# Patient Record
Sex: Female | Born: 1980 | Race: Black or African American | Hispanic: No | Marital: Single | Smoking: Never smoker
Health system: Southern US, Community
[De-identification: ages and names within clinical notes are randomized; demographics above are authoritative.]

## PROBLEM LIST (undated history)

## (undated) ENCOUNTER — Inpatient Hospital Stay (HOSPITAL_COMMUNITY): Payer: Self-pay

## (undated) DIAGNOSIS — O24419 Gestational diabetes mellitus in pregnancy, unspecified control: Secondary | ICD-10-CM

## (undated) DIAGNOSIS — D649 Anemia, unspecified: Secondary | ICD-10-CM

## (undated) HISTORY — DX: Gestational diabetes mellitus in pregnancy, unspecified control: O24.419

---
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
## 2001-04-16 ENCOUNTER — Other Ambulatory Visit: Admission: RE | Admit: 2001-04-16 | Discharge: 2001-04-16 | Payer: Self-pay | Admitting: Obstetrics and Gynecology

## 2001-04-28 ENCOUNTER — Encounter: Payer: Self-pay | Admitting: Family Medicine

## 2001-04-28 ENCOUNTER — Encounter: Admission: RE | Admit: 2001-04-28 | Discharge: 2001-04-28 | Payer: Self-pay | Admitting: Family Medicine

## 2001-04-29 ENCOUNTER — Encounter: Payer: Self-pay | Admitting: Emergency Medicine

## 2001-04-29 ENCOUNTER — Emergency Department (HOSPITAL_COMMUNITY): Admission: EM | Admit: 2001-04-29 | Discharge: 2001-04-29 | Payer: Self-pay | Admitting: Emergency Medicine

## 2001-08-19 ENCOUNTER — Emergency Department (HOSPITAL_COMMUNITY): Admission: EM | Admit: 2001-08-19 | Discharge: 2001-08-19 | Payer: Self-pay | Admitting: Emergency Medicine

## 2001-11-14 ENCOUNTER — Emergency Department (HOSPITAL_COMMUNITY): Admission: EM | Admit: 2001-11-14 | Discharge: 2001-11-14 | Payer: Self-pay | Admitting: *Deleted

## 2008-04-21 DIAGNOSIS — O24419 Gestational diabetes mellitus in pregnancy, unspecified control: Secondary | ICD-10-CM

## 2008-04-21 HISTORY — DX: Gestational diabetes mellitus in pregnancy, unspecified control: O24.419

## 2008-09-08 ENCOUNTER — Inpatient Hospital Stay (HOSPITAL_COMMUNITY): Admission: AD | Admit: 2008-09-08 | Discharge: 2008-09-08 | Payer: Self-pay | Admitting: Obstetrics & Gynecology

## 2008-09-08 ENCOUNTER — Ambulatory Visit: Payer: Self-pay | Admitting: Family Medicine

## 2009-01-13 DIAGNOSIS — O24410 Gestational diabetes mellitus in pregnancy, diet controlled: Secondary | ICD-10-CM

## 2010-07-30 LAB — URINALYSIS, ROUTINE W REFLEX MICROSCOPIC
Bilirubin Urine: NEGATIVE
Glucose, UA: NEGATIVE mg/dL
Hgb urine dipstick: NEGATIVE
Ketones, ur: NEGATIVE mg/dL
Nitrite: NEGATIVE
Protein, ur: NEGATIVE mg/dL
Specific Gravity, Urine: 1.02 (ref 1.005–1.030)
Urobilinogen, UA: 0.2 mg/dL (ref 0.0–1.0)
pH: 6 (ref 5.0–8.0)

## 2011-02-16 ENCOUNTER — Emergency Department (HOSPITAL_COMMUNITY)
Admission: EM | Admit: 2011-02-16 | Discharge: 2011-02-16 | Disposition: A | Discharge status: Home / Self Care | Payer: No Typology Code available for payment source | Attending: Emergency Medicine | Admitting: Emergency Medicine

## 2011-02-16 ENCOUNTER — Emergency Department (HOSPITAL_COMMUNITY): Payer: No Typology Code available for payment source

## 2011-02-16 DIAGNOSIS — S0180XA Unspecified open wound of other part of head, initial encounter: Secondary | ICD-10-CM | POA: Insufficient documentation

## 2011-02-16 DIAGNOSIS — Y92410 Unspecified street and highway as the place of occurrence of the external cause: Secondary | ICD-10-CM | POA: Insufficient documentation

## 2011-02-16 DIAGNOSIS — IMO0002 Reserved for concepts with insufficient information to code with codable children: Secondary | ICD-10-CM | POA: Insufficient documentation

## 2011-02-16 DIAGNOSIS — R109 Unspecified abdominal pain: Secondary | ICD-10-CM | POA: Insufficient documentation

## 2011-02-16 DIAGNOSIS — Y998 Other external cause status: Secondary | ICD-10-CM | POA: Insufficient documentation

## 2011-02-16 DIAGNOSIS — S060X9A Concussion with loss of consciousness of unspecified duration, initial encounter: Secondary | ICD-10-CM | POA: Insufficient documentation

## 2011-02-18 ENCOUNTER — Inpatient Hospital Stay (INDEPENDENT_AMBULATORY_CARE_PROVIDER_SITE_OTHER)
Admission: RE | Admit: 2011-02-18 | Discharge: 2011-02-18 | Disposition: A | Discharge status: Home / Self Care | Payer: No Typology Code available for payment source | Source: Ambulatory Visit | Attending: Family Medicine | Admitting: Family Medicine

## 2011-02-18 DIAGNOSIS — S139XXA Sprain of joints and ligaments of unspecified parts of neck, initial encounter: Secondary | ICD-10-CM

## 2012-10-05 ENCOUNTER — Emergency Department (HOSPITAL_COMMUNITY)
Admission: EM | Admit: 2012-10-05 | Discharge: 2012-10-05 | Disposition: A | Discharge status: Home / Self Care | Payer: Self-pay | Source: Home / Self Care | Attending: Family Medicine | Admitting: Family Medicine

## 2012-10-05 ENCOUNTER — Encounter (HOSPITAL_COMMUNITY): Payer: Self-pay | Admitting: Emergency Medicine

## 2012-10-05 ENCOUNTER — Other Ambulatory Visit (HOSPITAL_COMMUNITY)
Admission: RE | Admit: 2012-10-05 | Discharge: 2012-10-05 | Disposition: A | Discharge status: Home / Self Care | Payer: No Typology Code available for payment source | Source: Ambulatory Visit | Attending: Family Medicine | Admitting: Family Medicine

## 2012-10-05 DIAGNOSIS — N760 Acute vaginitis: Secondary | ICD-10-CM

## 2012-10-05 DIAGNOSIS — Z113 Encounter for screening for infections with a predominantly sexual mode of transmission: Secondary | ICD-10-CM | POA: Insufficient documentation

## 2012-10-05 LAB — POCT PREGNANCY, URINE: Preg Test, Ur: NEGATIVE

## 2012-10-05 LAB — POCT URINALYSIS DIP (DEVICE)
Glucose, UA: NEGATIVE mg/dL
Nitrite: NEGATIVE
Protein, ur: NEGATIVE mg/dL
Specific Gravity, Urine: >=1.03 (ref 1.005–1.030)
Urobilinogen, UA: 0.2 mg/dL (ref 0.0–1.0)

## 2012-10-05 MED ORDER — METRONIDAZOLE 500 MG PO TABS: 500.0000 mg | ORAL_TABLET | Freq: Two times a day (BID) | ORAL | Status: DC

## 2012-10-05 NOTE — ED Provider Notes (Signed)
History     CSN: [id number]  Arrival date & time 10/05/12  1327   First MD Initiated Contact with Patient 10/05/12 1336      Chief Complaint  Patient presents with  . Urinary Tract Infection  . Vaginitis    (Consider location/radiation/quality/duration/timing/severity/associated sxs/prior treatment) Patient is a 31 y.o. female presenting with urinary tract infection. The history is provided by the patient.  Urinary Tract Infection This is a new problem. The current episode started more than 1 week ago (just started menses after depo use, having odor and itching andurinary pressure). The problem has not changed since onset.Associated symptoms include abdominal pain.    History reviewed. No pertinent past medical history.  History reviewed. No pertinent past surgical history.  No family history on file.  History  Substance Use Topics  . Smoking status: Never Smoker   . Smokeless tobacco: Not on file  . Alcohol Use: Yes     Comment: occasionally    OB History   Grav Para Term Preterm Abortions TAB SAB Ect Mult Living                  Review of Systems  Constitutional: Negative.   Gastrointestinal: Positive for abdominal pain. Negative for nausea, vomiting, diarrhea and constipation.  Genitourinary: Positive for dysuria, vaginal bleeding and menstrual problem. Negative for vaginal discharge.  Musculoskeletal: Negative.   Skin: Negative.     Allergies  Review of patient's allergies indicates no known allergies.  Home Medications   Current Outpatient Rx  Name  Route  Sig  Dispense  Refill  . metroNIDAZOLE (FLAGYL) 500 MG tablet   Oral   Take 1 tablet (500 mg total) by mouth 2 (two) times daily.   14 tablet   0     BP 111/59  Pulse 83  Temp(Src) 98.6 F (37 C) (Oral)  Resp 16  SpO2 100%  LMP 10/05/2012  Physical Exam  Nursing note and vitals reviewed. Constitutional: She is oriented to person, place, and time. She appears well-developed and  well-nourished.  Abdominal: Soft. Bowel sounds are normal. She exhibits no distension and no mass. There is no tenderness. There is no rebound and no guarding.  Genitourinary: Uterus is tender. Uterus is not deviated and not enlarged. Cervix exhibits no motion tenderness, no discharge and no friability. Right adnexum displays no mass and no tenderness. Left adnexum displays no mass and no tenderness. There is bleeding around the vagina. No erythema or tenderness around the vagina. No foreign body around the vagina. No vaginal discharge found.    Neurological: She is alert and oriented to person, place, and time.  Skin: Skin is warm and dry.    ED Course  Procedures (including critical care time)  Labs Reviewed  POCT URINALYSIS DIP (DEVICE) - Abnormal; Notable for the following:    Hgb urine dipstick MODERATE (*)    Leukocytes, UA TRACE (*)    All other components within normal limits  POCT PREGNANCY, URINE  CERVICOVAGINAL ANCILLARY ONLY   No results found.   1. Vaginitis       MDM  U/a pt on menses        Linna Hoff, MD 10/05/12 1426

## 2012-10-05 NOTE — ED Notes (Signed)
Pt c/o sx including foul smelling urine and dysuria x 1 month. Patient stopped depo shot last month and began bleeding again. Took Metro pills and began having vaginal itching. Patient is alert and oriented.

## 2012-10-06 NOTE — ED Notes (Signed)
GC/Chlmaydia neg., Affirm: Candida pos., Gardnerella and Trich neg.  Message sent to Dr. Lucilla Edin order. Vassie Moselle 10/06/2012

## 2012-10-10 ENCOUNTER — Telehealth (HOSPITAL_COMMUNITY): Payer: Self-pay | Admitting: *Deleted

## 2012-10-10 MED ORDER — TERCONAZOLE 80 MG VA SUPP: 80.0000 mg | Freq: Every day | VAGINAL | Status: DC

## 2012-10-10 MED ORDER — FLUCONAZOLE 150 MG PO TABS: 150.0000 mg | ORAL_TABLET | Freq: Once | ORAL | Status: DC

## 2012-10-10 NOTE — ED Notes (Signed)
Order obtained for Diflucan and Terazol supp. from Dr. Artis Flock. I called pt. But VM is not set up.  Unable to leave a message. Call 1. Kimberly Macias 10/10/2012

## 2012-10-10 NOTE — ED Notes (Signed)
Pt. called back.  Pt. verified x 2 and given results.  Pt. told she needs Diflucan and Terazol supp. for Candida. Pt. wants Rx. called to the [facility] on [address].  Rx. called to VM @ [phone]. Pt. Called back a few minutes later and asked what the urine culture showed. I told her that one was not ordered. I gave her the result of the U-dip.  She said that is the main reason she came was for urinary symptoms.  She does not think the doctor was listening to what she said.  I told her to take this medicine and if still having symptoms to call back. Vassie Moselle 10/10/2012

## 2012-12-24 ENCOUNTER — Encounter (HOSPITAL_COMMUNITY): Payer: Self-pay | Admitting: *Deleted

## 2012-12-24 ENCOUNTER — Inpatient Hospital Stay (HOSPITAL_COMMUNITY)
Admission: AD | Admit: 2012-12-24 | Discharge: 2012-12-24 | Disposition: A | Discharge status: Home / Self Care | Payer: Self-pay | Source: Ambulatory Visit | Attending: Obstetrics & Gynecology | Admitting: Obstetrics & Gynecology

## 2012-12-24 DIAGNOSIS — R300 Dysuria: Secondary | ICD-10-CM | POA: Insufficient documentation

## 2012-12-24 DIAGNOSIS — A6000 Herpesviral infection of urogenital system, unspecified: Secondary | ICD-10-CM | POA: Insufficient documentation

## 2012-12-24 DIAGNOSIS — N949 Unspecified condition associated with female genital organs and menstrual cycle: Secondary | ICD-10-CM | POA: Insufficient documentation

## 2012-12-24 DIAGNOSIS — N390 Urinary tract infection, site not specified: Secondary | ICD-10-CM | POA: Insufficient documentation

## 2012-12-24 LAB — WET PREP, GENITAL
Trich, Wet Prep: NONE SEEN
Yeast Wet Prep HPF POC: NONE SEEN

## 2012-12-24 LAB — URINALYSIS, ROUTINE W REFLEX MICROSCOPIC
Bilirubin Urine: NEGATIVE
Glucose, UA: NEGATIVE mg/dL
Ketones, ur: NEGATIVE mg/dL
Nitrite: POSITIVE — AB
Protein, ur: 30 mg/dL — AB
pH: 5.5 (ref 5.0–8.0)

## 2012-12-24 LAB — URINE MICROSCOPIC-ADD ON

## 2012-12-24 MED ORDER — ONDANSETRON 8 MG PO TBDP
8.0000 mg | ORAL_TABLET | Freq: Once | ORAL | Status: AC
  Administered 2012-12-24: 8 mg via ORAL
  Filled 2012-12-24: qty 1

## 2012-12-24 MED ORDER — FLUCONAZOLE 150 MG PO TABS: 150.0000 mg | ORAL_TABLET | Freq: Once | ORAL | Status: DC

## 2012-12-24 MED ORDER — CIPROFLOXACIN HCL 500 MG PO TABS: 500.0000 mg | ORAL_TABLET | Freq: Two times a day (BID) | ORAL | Status: AC
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
## 2012-12-24 NOTE — MAU Note (Signed)
Patient states she has had a lump in the left groin area for 2 days that is painful and makes it difficult to walk, Has had pain with urination for about 4 days. Has an increase in her normal vaginal discharge. Nausea, no vomiting.
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
## 2012-12-25 LAB — GC/CHLAMYDIA PROBE AMP
CT Probe RNA: NEGATIVE
GC Probe RNA: NEGATIVE

## 2012-12-27 LAB — URINE CULTURE: Colony Count: >=100000

## 2013-11-12 ENCOUNTER — Emergency Department (HOSPITAL_COMMUNITY)
Admission: EM | Admit: 2013-11-12 | Discharge: 2013-11-12 | Disposition: A | Discharge status: Home / Self Care | Payer: Medicaid Other | Attending: Emergency Medicine | Admitting: Emergency Medicine

## 2013-11-12 ENCOUNTER — Encounter (HOSPITAL_COMMUNITY): Payer: Self-pay | Admitting: Emergency Medicine

## 2013-11-12 ENCOUNTER — Emergency Department (HOSPITAL_COMMUNITY): Payer: Medicaid Other

## 2013-11-12 DIAGNOSIS — Z79899 Other long term (current) drug therapy: Secondary | ICD-10-CM | POA: Insufficient documentation

## 2013-11-12 DIAGNOSIS — J020 Streptococcal pharyngitis: Secondary | ICD-10-CM | POA: Diagnosis not present

## 2013-11-12 DIAGNOSIS — R0789 Other chest pain: Secondary | ICD-10-CM | POA: Diagnosis not present

## 2013-11-12 DIAGNOSIS — R079 Chest pain, unspecified: Secondary | ICD-10-CM

## 2013-11-12 DIAGNOSIS — J029 Acute pharyngitis, unspecified: Secondary | ICD-10-CM | POA: Diagnosis present

## 2013-11-12 LAB — I-STAT TROPONIN, ED: Troponin i, poc: 0 ng/mL (ref 0.00–0.08)

## 2013-11-12 LAB — CBC
HEMATOCRIT: 40.6 % (ref 36.0–46.0)
Hemoglobin: 13.9 g/dL (ref 12.0–15.0)
MCH: 32.9 pg (ref 26.0–34.0)
MCHC: 34.2 g/dL (ref 30.0–36.0)
MCV: 96.2 fL (ref 78.0–100.0)
Platelets: 259 10*3/uL (ref 150–400)
RBC: 4.22 MIL/uL (ref 3.87–5.11)
RDW: 13.1 % (ref 11.5–15.5)
WBC: 7.7 10*3/uL (ref 4.0–10.5)

## 2013-11-12 LAB — BASIC METABOLIC PANEL
Anion gap: 19 — ABNORMAL HIGH (ref 5–15)
BUN: 10 mg/dL (ref 6–23)
CO2: 23 mEq/L (ref 19–32)
Calcium: 9.3 mg/dL (ref 8.4–10.5)
Chloride: 94 mEq/L — ABNORMAL LOW (ref 96–112)
Creatinine, Ser: 0.63 mg/dL (ref 0.50–1.10)
Glucose, Bld: 152 mg/dL — ABNORMAL HIGH (ref 70–99)
POTASSIUM: 3.1 meq/L — AB (ref 3.7–5.3)
Sodium: 136 mEq/L — ABNORMAL LOW (ref 137–147)

## 2013-11-12 MED ORDER — HYDROCODONE-ACETAMINOPHEN 7.5-325 MG/15ML PO SOLN
10.0000 mL | Freq: Once | ORAL | Status: AC
  Administered 2013-11-12: 10 mL via ORAL
  Filled 2013-11-12: qty 15

## 2013-11-12 MED ORDER — HYDROCODONE-ACETAMINOPHEN 7.5-325 MG/15ML PO SOLN: 10.0000 mL | ORAL | Status: DC | PRN

## 2013-11-12 MED ORDER — ACETAMINOPHEN 325 MG PO TABS
650.0000 mg | ORAL_TABLET | Freq: Once | ORAL | Status: AC
  Administered 2013-11-12: 650 mg via ORAL
  Filled 2013-11-12: qty 2

## 2013-11-12 MED ORDER — PENICILLIN G BENZATHINE 1200000 UNIT/2ML IM SUSP
1.2000 10*6.[IU] | Freq: Once | INTRAMUSCULAR | Status: AC
  Administered 2013-11-12: 1.2 10*6.[IU] via INTRAMUSCULAR
  Filled 2013-11-12: qty 2

## 2013-11-12 NOTE — ED Notes (Signed)
Pt reports having a sore throat for several days, thinks she has strep throat but now also having chest pains x 2 days. Denies cough. Reports fever. Airway intact.

## 2013-11-12 NOTE — Discharge Instructions (Signed)
You have been treated for strep pharyngitis. Take the prescribed medication as directed. Follow-up with Dr. Terrence Dupont if chest pain continues. Return to the ED for new or worsening symptoms.

## 2013-11-12 NOTE — ED Provider Notes (Signed)
CSN: [id number]     Arrival date & time 11/12/13  1118 History   First MD Initiated Contact with Patient 11/12/13 1127     Chief Complaint  Patient presents with  . Chest Pain  . Sore Throat     (Consider location/radiation/quality/duration/timing/severity/associated sxs/prior Treatment) The history is provided by the patient and medical records.   This is a 33 y.o. F presenting to the ED for sore throat and chest pain.  Patient sx states initially started as a sore throat approx 5 days ago.  She states she saw "white spots" on both her tonsils.  She states swallowing is very painful but she has no difficulty doing so.  Endorses subjective fever.  States 2 days ago she developed left sided chest pain, initially was a 9/10 but has started to resolve.  She denies associated SOB, diaphoresis, radiation into neck or extremities, palpitations, dizziness, weakness.  She has no prior hx of DVT or PE.  No recent travel, surgeries, exogenous estrogens, calf swelling, or calf pain.  She is currently following with Dr. Terrence Dupont for evaluation of possible arrythmia but has not been wearing her heart monitor as instructed.  History reviewed. No pertinent past medical history. History reviewed. No pertinent past surgical history. History reviewed. No pertinent family history. History  Substance Use Topics  . Smoking status: Never Smoker   . Smokeless tobacco: Not on file  . Alcohol Use: Yes     Comment: occasionally   OB History   Grav Para Term Preterm Abortions TAB SAB Ect Mult Living   3 1 1  0 2 2 0 0 0 1     Review of Systems  HENT: Positive for sore throat.   Cardiovascular: Positive for chest pain.  All other systems reviewed and are negative.     Allergies  Review of patient's allergies indicates no known allergies.  Home Medications   Prior to Admission medications   Medication Sig Start Date End Date Taking? Authorizing Provider  fluconazole (DIFLUCAN) 150 MG tablet Take 1  tablet (150 mg total) by mouth once. 12/24/12   Olegario Messier, NP  HYDROcodone-acetaminophen (NORCO/VICODIN) 5-325 MG per tablet Take 1 tablet by mouth every 6 (six) hours as needed for pain.    Historical Provider, MD  Phenazopyridine HCl (AZO STANDARD MAXIMUM STRENGTH) 97.5 MG TABS Take by mouth.    Historical Provider, MD   BP 119/72  Pulse 100  Temp(Src) 100.4 F (38 C) (Oral)  Resp 16  SpO2 99%  LMP 11/02/2013  Physical Exam  Nursing note and vitals reviewed. Constitutional: She is oriented to person, place, and time. She appears well-developed and well-nourished.  HENT:  Head: Normocephalic and atraumatic.  Mouth/Throat: Uvula is midline and mucous membranes are normal. Posterior oropharyngeal erythema present. No tonsillar abscesses.  Tonsils 1+ bilaterally with exudates present; uvula midline without peritonsillar abscess; handling secretions appropriately; no difficulty swallowing or speaking  Eyes: Conjunctivae and EOM are normal. Pupils are equal, round, and reactive to light.  Neck: Normal range of motion.  Cardiovascular: Normal rate, regular rhythm and normal heart sounds.   Pulmonary/Chest: Effort normal and breath sounds normal. No respiratory distress. She has no wheezes.  Abdominal: Soft. Bowel sounds are normal.  Musculoskeletal: Normal range of motion.  Neurological: She is alert and oriented to person, place, and time.  Skin: Skin is warm and dry.  Psychiatric: She has a normal mood and affect.    ED Course  Procedures (including critical care time) Labs Review  Labs Reviewed  BASIC METABOLIC PANEL - Abnormal; Notable for the following:    Sodium 136 (*)    Potassium 3.1 (*)    Chloride 94 (*)    Glucose, Bld 152 (*)    Anion gap 19 (*)    All other components within normal limits  CBC  I-STAT TROPOININ, ED    Imaging Review Dg Chest 2 View  11/12/2013   CLINICAL DATA:  Strep throat, fever and chest pain.  EXAM: CHEST  2 VIEW  COMPARISON:   02/16/2011.  FINDINGS: The heart size and mediastinal contours are within normal limits. Both lungs are clear. The visualized skeletal structures are unremarkable.  IMPRESSION: Normal chest x-ray.   Electronically Signed   By: Kalman Jewels M.D.   On: 11/12/2013 13:05     EKG Interpretation   Date/Time:  Saturday November 12 2013 11:46:07 EDT Ventricular Rate:  94 PR Interval:  134 QRS Duration: 76 QT Interval:  345 QTC Calculation: 431 R Axis:   73 Text Interpretation:  Sinus rhythm Normal ECG Confirmed by BEATON  MD,  ROBERT ([id number]) on 11/12/2013 12:03:54 PM      MDM   Final diagnoses:  Strep pharyngitis  Chest pain, unspecified chest pain type   33 y.o. F with sore throat and CP.  On exam she is afebrile and non-toxic appearing, airway is patent, tonsils enlarged with exudates present.  Clinical concern for streph pharyngitis.  CP sounds atypical but will obtain basic labs and CXR.  Pt treated with bicillin for strep.  hycet and tylenol given.  EKG sinus rhythm without ischemic changes.  Trop negative.  Lab work with subtle electrolyte abnormalities, anion gap elevated at 19 but non-specific, glucose 152.  Clinically no signs/sx concerning for DKA.  Patient remains afebrile and overall well appearing. At this time I have low suspicion for a ACS, PE, dissection, or acute cardiac event and so the patient may be safely discharged home. Rx hycet, recommended supportive care including fluids and rest.  She is instructed to follow up with Dr. Terrence Dupont.  Discussed plan with patient, he/she acknowledged understanding and agreed with plan of care.  Return precautions given for new or worsening symptoms.  Larene Pickett, PA-C 11/12/13 Vernelle Emerald

## 2013-11-13 NOTE — ED Provider Notes (Signed)
Medical screening examination/treatment/procedure(s) were performed by non-physician practitioner and as supervising physician I was immediately available for consultation/collaboration.    Dot Lanes, MD 11/13/13 [phone]

## 2013-12-07 ENCOUNTER — Emergency Department (INDEPENDENT_AMBULATORY_CARE_PROVIDER_SITE_OTHER)
Admission: EM | Admit: 2013-12-07 | Discharge: 2013-12-07 | Disposition: A | Discharge status: Home / Self Care | Payer: Self-pay | Source: Home / Self Care | Attending: Emergency Medicine | Admitting: Emergency Medicine

## 2013-12-07 ENCOUNTER — Encounter (HOSPITAL_COMMUNITY): Payer: Self-pay | Admitting: Emergency Medicine

## 2013-12-07 ENCOUNTER — Other Ambulatory Visit (HOSPITAL_COMMUNITY)
Admission: RE | Admit: 2013-12-07 | Discharge: 2013-12-07 | Disposition: A | Discharge status: Home / Self Care | Payer: Medicaid Other | Source: Ambulatory Visit | Attending: Emergency Medicine | Admitting: Emergency Medicine

## 2013-12-07 DIAGNOSIS — Z113 Encounter for screening for infections with a predominantly sexual mode of transmission: Secondary | ICD-10-CM | POA: Insufficient documentation

## 2013-12-07 DIAGNOSIS — B379 Candidiasis, unspecified: Secondary | ICD-10-CM

## 2013-12-07 DIAGNOSIS — A499 Bacterial infection, unspecified: Secondary | ICD-10-CM

## 2013-12-07 DIAGNOSIS — N760 Acute vaginitis: Secondary | ICD-10-CM | POA: Insufficient documentation

## 2013-12-07 DIAGNOSIS — B9689 Other specified bacterial agents as the cause of diseases classified elsewhere: Secondary | ICD-10-CM

## 2013-12-07 MED ORDER — METRONIDAZOLE 500 MG PO TABS: 2000.0000 mg | ORAL_TABLET | Freq: Once | ORAL | Status: DC

## 2013-12-07 MED ORDER — FLUCONAZOLE 150 MG PO TABS: 150.0000 mg | ORAL_TABLET | Freq: Once | ORAL | Status: DC

## 2013-12-07 NOTE — ED Notes (Signed)
Patient complains of having greenish/yellow vaginal discharge That started about four days ago Denies any odor

## 2013-12-07 NOTE — Discharge Instructions (Signed)
It looks like you have BV and a yeast infection. Take the flagyl first - 4 pills all at once.  This will likely upset your stomach. The next day take diflucan 1 pill.   You can repeat this dose in 2 days if needed.  We will call you if anything else comes back positive. Followup as needed.

## 2013-12-07 NOTE — ED Provider Notes (Signed)
CSN: [id number]     Arrival date & time 12/07/13  1807 History   First MD Initiated Contact with Patient 12/07/13 1854     Chief Complaint  Patient presents with  . Vaginal Discharge   (Consider location/radiation/quality/duration/timing/severity/associated sxs/prior Treatment) HPI  is a 33 year old female here for evaluation of vaginal discharge. She states this started about 4 days ago with a thick white discharge. Was associated with itching. She douched and took several metronidazole tablets. The discharge changed to a more yellow-green color. It remains itchy. No vaginal pain. No foul odor. No new sexual contacts, but has had unprotected sex with her partner. No abdominal pain. No fevers or chills. No nausea or vomiting. No urinary symptoms.  History reviewed. No pertinent past medical history. History reviewed. No pertinent past surgical history. No family history on file. History  Substance Use Topics  . Smoking status: Never Smoker   . Smokeless tobacco: Not on file  . Alcohol Use: Yes     Comment: occasionally   OB History   Grav Para Term Preterm Abortions TAB SAB Ect Mult Living   3 1 1  0 2 2 0 0 0 1     Review of Systems  Constitutional: Negative.   Gastrointestinal: Negative.   Genitourinary: Positive for vaginal discharge. Negative for dysuria and vaginal pain.  Musculoskeletal: Negative.     Allergies  Review of patient's allergies indicates no known allergies.  Home Medications   Prior to Admission medications   Medication Sig Start Date End Date Taking? Authorizing Provider  fluconazole (DIFLUCAN) 150 MG tablet Take 1 tablet (150 mg total) by mouth once. Repeat dose in 3 days if needed 12/07/13   Melony Overly, MD  HYDROcodone-acetaminophen (HYCET) 7.5-325 mg/15 ml solution Take 10 mLs by mouth every 4 (four) hours as needed for moderate pain or severe pain. 11/12/13   Larene Pickett, PA-C  metroNIDAZOLE (FLAGYL) 500 MG tablet Take 4 tablets (2,000 mg total) by  mouth once. 12/07/13   Melony Overly, MD  norgestimate-ethinyl estradiol (SPRINTEC 28) 0.25-35 MG-MCG tablet Take 1 tablet by mouth daily.    Historical Provider, MD   BP 111/73  Pulse 67  Temp(Src) 99.2 F (37.3 C) (Oral)  Resp 16  SpO2 100%  LMP 11/02/2013 Physical Exam  Constitutional: She is oriented to person, place, and time. She appears well-developed and well-nourished. No distress.  Cardiovascular: Normal rate.   Pulmonary/Chest: Effort normal.  Abdominal: Soft.  Genitourinary: There is rash (erythema) on the right labia. There is no lesion on the right labia. There is rash (erythema) on the left labia. There is no lesion on the left labia. Cervix exhibits no discharge and no friability. Vaginal discharge (yellow green discharge with thick white clumps) found.  Neurological: She is alert and oriented to person, place, and time.    ED Course  Procedures (including critical care time) Labs Review Labs Reviewed  CERVICOVAGINAL ANCILLARY ONLY    Imaging Review No results found.   MDM   1. BV (bacterial vaginosis)   2. Yeast infection    Exam consistent with BV and yeast infection. We'll treat with Flagyl 2 g once followed by Diflucan 150 mg once. Also collected GC and Chlamydia. Will treat based on results. Discussed avoiding douching, scents or dyes in soaps and panty liners, and wearing cotton underwear to prevent future infections. Followup as needed.    Melony Overly, MD 12/07/13 [phone]

## 2013-12-08 NOTE — ED Notes (Signed)
Patient called, asking about her rx not being at pharmacy. After speaking w patient, was discovered she has written Rx .Patient asked to be connected to clinic manager

## 2013-12-13 NOTE — ED Notes (Signed)
GC/Chlamydia neg., Affirm: Candida and Gardnerella pos., Trich neg.  Pt. adequately treated with Diflucan and Flagyl. Roselyn Meier 12/13/2013

## 2014-02-20 ENCOUNTER — Encounter (HOSPITAL_COMMUNITY): Payer: Self-pay | Admitting: Emergency Medicine

## 2015-06-20 ENCOUNTER — Emergency Department (HOSPITAL_COMMUNITY): Payer: Medicaid Other

## 2015-06-20 ENCOUNTER — Encounter (HOSPITAL_COMMUNITY): Payer: Self-pay

## 2015-06-20 ENCOUNTER — Emergency Department (HOSPITAL_COMMUNITY)
Admission: EM | Admit: 2015-06-20 | Discharge: 2015-06-20 | Disposition: A | Discharge status: Home / Self Care | Payer: Medicaid Other | Attending: Emergency Medicine | Admitting: Emergency Medicine

## 2015-06-20 DIAGNOSIS — Z3202 Encounter for pregnancy test, result negative: Secondary | ICD-10-CM | POA: Insufficient documentation

## 2015-06-20 DIAGNOSIS — J069 Acute upper respiratory infection, unspecified: Secondary | ICD-10-CM | POA: Insufficient documentation

## 2015-06-20 DIAGNOSIS — Z793 Long term (current) use of hormonal contraceptives: Secondary | ICD-10-CM | POA: Diagnosis not present

## 2015-06-20 DIAGNOSIS — R05 Cough: Secondary | ICD-10-CM | POA: Diagnosis present

## 2015-06-20 LAB — CBC
HEMATOCRIT: 38.1 % (ref 36.0–46.0)
HEMOGLOBIN: 12.8 g/dL (ref 12.0–15.0)
MCH: 32.3 pg (ref 26.0–34.0)
MCHC: 33.6 g/dL (ref 30.0–36.0)
MCV: 96.2 fL (ref 78.0–100.0)
Platelets: 226 10*3/uL (ref 150–400)
RBC: 3.96 MIL/uL (ref 3.87–5.11)
RDW: 13.2 % (ref 11.5–15.5)
WBC: 2.4 10*3/uL — ABNORMAL LOW (ref 4.0–10.5)

## 2015-06-20 LAB — COMPREHENSIVE METABOLIC PANEL
ALBUMIN: 4.1 g/dL (ref 3.5–5.0)
ALK PHOS: 50 U/L (ref 38–126)
ALT: 22 U/L (ref 14–54)
AST: 24 U/L (ref 15–41)
Anion gap: 7 (ref 5–15)
BILIRUBIN TOTAL: 0.4 mg/dL (ref 0.3–1.2)
BUN: 8 mg/dL (ref 6–20)
CO2: 25 mmol/L (ref 22–32)
CREATININE: 0.65 mg/dL (ref 0.44–1.00)
Calcium: 8.8 mg/dL — ABNORMAL LOW (ref 8.9–10.3)
Chloride: 106 mmol/L (ref 101–111)
GFR calc Af Amer: >60 mL/min (ref >60)
GFR calc non Af Amer: >60 mL/min (ref >60)
GLUCOSE: 119 mg/dL — AB (ref 65–99)
Potassium: 3.6 mmol/L (ref 3.5–5.1)
Sodium: 138 mmol/L (ref 135–145)
TOTAL PROTEIN: 6.9 g/dL (ref 6.5–8.1)

## 2015-06-20 LAB — URINALYSIS, ROUTINE W REFLEX MICROSCOPIC
Bilirubin Urine: NEGATIVE
GLUCOSE, UA: NEGATIVE mg/dL
HGB URINE DIPSTICK: NEGATIVE
Ketones, ur: NEGATIVE mg/dL
Leukocytes, UA: NEGATIVE
Nitrite: NEGATIVE
Protein, ur: NEGATIVE mg/dL
Specific Gravity, Urine: 1.029 (ref 1.005–1.030)
pH: 5.5 (ref 5.0–8.0)

## 2015-06-20 LAB — I-STAT TROPONIN, ED: Troponin i, poc: 0 ng/mL (ref 0.00–0.08)

## 2015-06-20 LAB — I-STAT BETA HCG BLOOD, ED (MC, WL, AP ONLY): I-stat hCG, quantitative: <5 m[IU]/mL (ref <5)

## 2015-06-20 LAB — LIPASE, BLOOD: Lipase: 27 U/L (ref 11–51)

## 2015-06-20 MED ORDER — ONDANSETRON HCL 4 MG PO TABS: 4.0000 mg | ORAL_TABLET | Freq: Four times a day (QID) | ORAL | Status: DC

## 2015-06-20 MED ORDER — KETOROLAC TROMETHAMINE 30 MG/ML IJ SOLN
30.0000 mg | Freq: Once | INTRAMUSCULAR | Status: AC
  Administered 2015-06-20: 30 mg via INTRAVENOUS
  Filled 2015-06-20: qty 1

## 2015-06-20 MED ORDER — METOCLOPRAMIDE HCL 5 MG/ML IJ SOLN
10.0000 mg | Freq: Once | INTRAMUSCULAR | Status: AC
  Administered 2015-06-20: 10 mg via INTRAVENOUS
  Filled 2015-06-20: qty 2

## 2015-06-20 MED ORDER — SODIUM CHLORIDE 0.9 % IV BOLUS (SEPSIS)
1000.0000 mL | Freq: Once | INTRAVENOUS | Status: AC
  Administered 2015-06-20: 1000 mL via INTRAVENOUS

## 2015-06-20 MED ORDER — OSELTAMIVIR PHOSPHATE 75 MG PO CAPS: 75.0000 mg | ORAL_CAPSULE | Freq: Two times a day (BID) | ORAL | Status: DC

## 2015-06-20 MED ORDER — DIPHENHYDRAMINE HCL 50 MG/ML IJ SOLN
25.0000 mg | Freq: Once | INTRAMUSCULAR | Status: AC
  Administered 2015-06-20: 25 mg via INTRAVENOUS
  Filled 2015-06-20: qty 1

## 2015-06-20 NOTE — ED Provider Notes (Signed)
CSN: [id number]     Arrival date & time 06/20/15  1630 History   First MD Initiated Contact with Patient 06/20/15 1930     Chief Complaint  Patient presents with  . Headache  . Cough  . Chest Pain   HPI   Kimberly Macias is a 35 y.o. female with no significant PMH presenting with a 1 day history of generalized body aches, nonproductive cough, emesis (3, nonbloody nonbilious). She has a headache, describes it as frontal in location, constant, nonradiating, 8 out of 10 pain scale. She endorses taking an old Z-Pak without relief. She endorses possible ill contacts, and she has traveled recently, but she is unsure of what people may have had. She states she is also concerned because she "holds her urine whenever she gets sick." She denies vaginal complaints or dysuria.  History reviewed. No pertinent past medical history. History reviewed. No pertinent past surgical history. History reviewed. No pertinent family history. Social History  Substance Use Topics  . Smoking status: Never Smoker   . Smokeless tobacco: None  . Alcohol Use: Yes     Comment: occasionally   OB History    Gravida Para Term Preterm AB TAB SAB Ectopic Multiple Living   3 1 1  0 2 2 0 0 0 1     Review of Systems  Ten systems are reviewed and are negative for acute change except as noted in the HPI  Allergies  Review of patient's allergies indicates no known allergies.  Home Medications   Prior to Admission medications   Medication Sig Start Date End Date Taking? Authorizing Provider  fluconazole (DIFLUCAN) 150 MG tablet Take 1 tablet (150 mg total) by mouth once. Repeat dose in 3 days if needed 12/07/13   Melony Overly, MD  HYDROcodone-acetaminophen (HYCET) 7.5-325 mg/15 ml solution Take 10 mLs by mouth every 4 (four) hours as needed for moderate pain or severe pain. 11/12/13   Larene Pickett, PA-C  metroNIDAZOLE (FLAGYL) 500 MG tablet Take 4 tablets (2,000 mg total) by mouth once. 12/07/13   Melony Overly, MD   norgestimate-ethinyl estradiol (SPRINTEC 28) 0.25-35 MG-MCG tablet Take 1 tablet by mouth daily.    Historical Provider, MD   BP 117/74 mmHg  Pulse 80  Temp(Src) 99.5 F (37.5 C) (Oral)  Resp 20  SpO2 98%  LMP 06/05/2015 Physical Exam  Constitutional: She appears well-developed and well-nourished. No distress.  HENT:  Head: Normocephalic and atraumatic.  Mouth/Throat: Oropharynx is clear and moist. No oropharyngeal exudate.  Eyes: Conjunctivae are normal. Pupils are equal, round, and reactive to light. Right eye exhibits no discharge. Left eye exhibits no discharge. No scleral icterus.  Neck: No tracheal deviation present.  Cardiovascular: Normal rate, regular rhythm, normal heart sounds and intact distal pulses.  Exam reveals no gallop and no friction rub.   No murmur heard. Pulmonary/Chest: Effort normal and breath sounds normal. No respiratory distress. She has no wheezes. She has no rales. She exhibits no tenderness.  Abdominal: Soft. Bowel sounds are normal. She exhibits no distension and no mass. There is no tenderness. There is no rebound and no guarding.  Musculoskeletal: She exhibits no edema.  Lymphadenopathy:    She has no cervical adenopathy.  Neurological: She is alert. Coordination normal.  Skin: Skin is warm and dry. No rash noted. She is not diaphoretic. No erythema.  Psychiatric: She has a normal mood and affect. Her behavior is normal.  Nursing note and vitals reviewed.   ED Course  Procedures  Labs Review Labs Reviewed  CBC - Abnormal; Notable for the following:    WBC 2.4 (*)    All other components within normal limits  COMPREHENSIVE METABOLIC PANEL - Abnormal; Notable for the following:    Glucose, Bld 119 (*)    Calcium 8.8 (*)    All other components within normal limits  LIPASE, BLOOD  URINALYSIS, ROUTINE W REFLEX MICROSCOPIC (NOT AT [facility])  I-STAT TROPOININ, ED  I-STAT BETA HCG BLOOD, ED ([facility], [facility], [facility] ONLY)   Imaging Review Dg Chest 2  View  06/20/2015  CLINICAL DATA:  Pt c/o headache, generalized chest discomfort, cough, abdominal pain, emesis, and generalized body aches x 1 day. EXAM: CHEST  2 VIEW COMPARISON:  11/12/2013 FINDINGS: The heart size and mediastinal contours are within normal limits. Both lungs are clear. No pleural effusion or pneumothorax. The visualized skeletal structures are unremarkable. IMPRESSION: Normal chest radiographs. Electronically Signed   By: Lajean Manes M.D.   On: 06/20/2015 17:23   I have personally reviewed and evaluated these images and lab results as part of my medical decision-making.   EKG Interpretation   Date/Time:  Wednesday June 20 2015 16:34:53 EST Ventricular Rate:  78 PR Interval:  139 QRS Duration: 71 QT Interval:  381 QTC Calculation: 434 R Axis:   80 Text Interpretation:  Sinus rhythm Right atrial enlargement Baseline  wander in lead(s) V2 ED PHYSICIAN INTERPRETATION AVAILABLE IN [facility] Confirmed by TEST, Record ([id number]) on 06/21/2015 7:30:23 AM      MDM   Final diagnoses:  Upper respiratory infection   HCG, CBC, CMP, lipase, troponin, chest x-ray, EKG unremarkable for acute change. Most likely viral URI. Will treat headache and reevaluate. Patient feeling better upon reassessment.  UA negative.  Patient may be safely discharged home. Discussed reasons for return. Patient to follow-up with primary care provider within one week. Patient in understanding and agreement with the plan.   Theba Lions, PA-C 06/23/15 [id number]  Leo Grosser, MD 06/23/15 1455

## 2015-06-20 NOTE — Discharge Instructions (Signed)
[name],  Nice meeting you! Please follow-up with your primary care provider. Return to the emergency department if you develop increased pain, shortness of breath, are unable to keep foods down. Feel better soon!  S. Wendie Simmer, PA-C Upper Respiratory Infection, Adult Most upper respiratory infections (URIs) are a viral infection of the air passages leading to the lungs. A URI affects the nose, throat, and upper air passages. The most common type of URI is nasopharyngitis and is typically referred to as "the common cold." URIs run their course and usually go away on their own. Most of the time, a URI does not require medical attention, but sometimes a bacterial infection in the upper airways can follow a viral infection. This is called a secondary infection. Sinus and middle ear infections are common types of secondary upper respiratory infections. Bacterial pneumonia can also complicate a URI. A URI can worsen asthma and chronic obstructive pulmonary disease (COPD). Sometimes, these complications can require emergency medical care and may be life threatening.  CAUSES Almost all URIs are caused by viruses. A virus is a type of germ and can spread from one person to another.  RISKS FACTORS You may be at risk for a URI if:   You smoke.   You have chronic heart or lung disease.  You have a weakened defense (immune) system.   You are very young or very old.   You have nasal allergies or asthma.  You work in crowded or poorly ventilated areas.  You work in health care facilities or schools. SIGNS AND SYMPTOMS  Symptoms typically develop 2-3 days after you come in contact with a cold virus. Most viral URIs last 7-10 days. However, viral URIs from the influenza virus (flu virus) can last 14-18 days and are typically more severe. Symptoms may include:   Runny or stuffy (congested) nose.   Sneezing.   Cough.   Sore throat.   Headache.   Fatigue.   Fever.    Loss of appetite.   Pain in your forehead, behind your eyes, and over your cheekbones (sinus pain).  Muscle aches.  DIAGNOSIS  Your health care provider may diagnose a URI by:  Physical exam.  Tests to check that your symptoms are not due to another condition such as:  Strep throat.  Sinusitis.  Pneumonia.  Asthma. TREATMENT  A URI goes away on its own with time. It cannot be cured with medicines, but medicines may be prescribed or recommended to relieve symptoms. Medicines may help:  Reduce your fever.  Reduce your cough.  Relieve nasal congestion. HOME CARE INSTRUCTIONS   Take medicines only as directed by your health care provider.   Gargle warm saltwater or take cough drops to comfort your throat as directed by your health care provider.  Use a warm mist humidifier or inhale steam from a shower to increase air moisture. This may make it easier to breathe.  Drink enough fluid to keep your urine clear or pale yellow.   Eat soups and other clear broths and maintain good nutrition.   Rest as needed.   Return to work when your temperature has returned to normal or as your health care provider advises. You may need to stay home longer to avoid infecting others. You can also use a face mask and careful hand washing to prevent spread of the virus.  Increase the usage of your inhaler if you have asthma.   Do not use any tobacco products, including cigarettes, chewing tobacco,  or electronic cigarettes. If you need help quitting, ask your health care provider. PREVENTION  The best way to protect yourself from getting a cold is to practice good hygiene.   Avoid oral or hand contact with people with cold symptoms.   Wash your hands often if contact occurs.  There is no clear evidence that vitamin C, vitamin E, echinacea, or exercise reduces the chance of developing a cold. However, it is always recommended to get plenty of rest, exercise, and practice good  nutrition.  SEEK MEDICAL CARE IF:   You are getting worse rather than better.   Your symptoms are not controlled by medicine.   You have chills.  You have worsening shortness of breath.  You have brown or red mucus.  You have yellow or brown nasal discharge.  You have pain in your face, especially when you bend forward.  You have a fever.  You have swollen neck glands.  You have pain while swallowing.  You have white areas in the back of your throat. SEEK IMMEDIATE MEDICAL CARE IF:   You have severe or persistent:  Headache.  Ear pain.  Sinus pain.  Chest pain.  You have chronic lung disease and any of the following:  Wheezing.  Prolonged cough.  Coughing up blood.  A change in your usual mucus.  You have a stiff neck.  You have changes in your:  Vision.  Hearing.  Thinking.  Mood. MAKE SURE YOU:   Understand these instructions.  Will watch your condition.  Will get help right away if you are not doing well or get worse.   This information is not intended to replace advice given to you by your health care provider. Make sure you discuss any questions you have with your health care provider.   Document Released: 10/01/2000 Document Revised: 08/22/2014 Document Reviewed: 07/13/2013 Elsevier Interactive Patient Education [employer].

## 2015-06-20 NOTE — ED Notes (Signed)
Pt c/o headache, generalized chest discomfort, cough, abdominal pain, emesis, and generalized body aches x 1 day.  Pain score 8/10.  Pt reports taking OTC medication and "an old Z-pack" this morning.  Pt unaware if she has been around anyone sick.  Pt did not have flu shot.

## 2015-06-20 NOTE — ED Notes (Signed)
Pt is unable to give a urine sample pt is aware one is needed

## 2016-07-04 ENCOUNTER — Ambulatory Visit (HOSPITAL_COMMUNITY)
Admission: AD | Admit: 2016-07-04 | Discharge: 2016-07-05 | Disposition: A | Discharge status: Home / Self Care | Payer: Medicaid Other | Source: Ambulatory Visit | Attending: Family Medicine | Admitting: Family Medicine

## 2016-07-04 DIAGNOSIS — O009 Unspecified ectopic pregnancy without intrauterine pregnancy: Secondary | ICD-10-CM | POA: Diagnosis present

## 2016-07-04 DIAGNOSIS — O00102 Left tubal pregnancy without intrauterine pregnancy: Secondary | ICD-10-CM | POA: Insufficient documentation

## 2016-07-04 DIAGNOSIS — O26891 Other specified pregnancy related conditions, first trimester: Secondary | ICD-10-CM

## 2016-07-04 DIAGNOSIS — R109 Unspecified abdominal pain: Secondary | ICD-10-CM

## 2016-07-04 DIAGNOSIS — Z3A01 Less than 8 weeks gestation of pregnancy: Secondary | ICD-10-CM | POA: Insufficient documentation

## 2016-07-04 NOTE — MAU Note (Signed)
Patient arrived via EMS with complaints of abdominal pain, nausea, and vaginal bleeding that began at 2030.  Had a tampon in at the time so not sure when vaginal bleeding started.  Has not noticed any bleeding since removing the tampon.  Rates cramping 10/10 that started suddenly. Had positive UPT on 3/11.  Reports recent foul smelling discharge.  Has not made any plans yet to begin [facility].

## 2016-07-04 NOTE — MAU Provider Note (Signed)
History     CSN: [id number]  Arrival date and time: 07/04/16 2322   First Provider Initiated Contact with Patient 07/04/16 2349      Chief Complaint  Patient presents with  . Abdominal Pain  . Vaginal Bleeding   HPI  [name] is a 36 y.o. [id number] at Unknown who presents to MAU today with complaint of abdominal pain and spotting that started suddenly this evening. The patient rates her pain at 10/10 now. She has not taken anything for pain. The pain is bilateral in the lower abdomen. She states thin, white discharge and spotting noted as well. She had one episode of emesis when the pain started. She denies nausea currently. She states that she feels dizzy with standing and then has nausea. She denies fever, diarrhea or UTI symptoms.     OB History    Gravida Para Term Preterm AB Living   4 1 1  0 2 1   SAB TAB Ectopic Multiple Live Births   0 2 0 0 1      No past medical history on file.  No past surgical history on file.  No family history on file.  Social History  Substance Use Topics  . Smoking status: Never Smoker  . Smokeless tobacco: Not on file  . Alcohol use Yes     Comment: occasionally    Allergies: No Known Allergies  Prescriptions Prior to Admission  Medication Sig Dispense Refill Last Dose  . fluconazole (DIFLUCAN) 150 MG tablet Take 1 tablet (150 mg total) by mouth once. Repeat dose in 3 days if needed (Patient not taking: Reported on 06/20/2015) 3 tablet 0 Completed Course at Unknown time  . HYDROcodone-acetaminophen (HYCET) 7.5-325 mg/15 ml solution Take 10 mLs by mouth every 4 (four) hours as needed for moderate pain or severe pain. (Patient not taking: Reported on 06/20/2015) 75 mL 0 Completed Course at Unknown time  . metroNIDAZOLE (FLAGYL) 500 MG tablet Take 4 tablets (2,000 mg total) by mouth once. (Patient not taking: Reported on 06/20/2015) 4 tablet 0 Completed Course at Unknown time  . ondansetron (ZOFRAN) 4 MG tablet Take 1 tablet (4 mg  total) by mouth every 6 (six) hours. 12 tablet 0   . oseltamivir (TAMIFLU) 75 MG capsule Take 1 capsule (75 mg total) by mouth every 12 (twelve) hours. 10 capsule 0     Review of Systems  Constitutional: Negative for fever.  Gastrointestinal: Positive for abdominal pain, nausea and vomiting. Negative for constipation and diarrhea.  Genitourinary: Positive for vaginal bleeding and vaginal discharge. Negative for dysuria, frequency and urgency.  Neurological: Positive for dizziness.   Physical Exam   Blood pressure 114/67, pulse 75, temperature 98.5 F (36.9 C), temperature source Oral, resp. rate 18, last menstrual period 06/02/2016.  Physical Exam  Nursing note and vitals reviewed. Constitutional: She is oriented to person, place, and time. She appears well-developed and well-nourished. No distress.  HENT:  Head: Normocephalic and atraumatic.  Cardiovascular: Normal rate.   Respiratory: Effort normal.  GI: Soft. She exhibits no distension and no mass. There is tenderness (moderate lower abdominal tenderness to palpation bilaterally). There is no rebound and no guarding.  Genitourinary: Uterus is tender. Uterus is not enlarged. Cervix exhibits no motion tenderness, no discharge and no friability. Right adnexum displays tenderness. Right adnexum displays no mass. Left adnexum displays tenderness. Left adnexum displays no mass. No bleeding in the vagina. Vaginal discharge (small amount of thin, white discharge noted) found.  Neurological: She is  alert and oriented to person, place, and time.  Skin: Skin is warm and dry. No erythema.  Psychiatric: She has a normal mood and affect.  Dilation: Closed Effacement (%): Thick Cervical Position: Posterior Exam by:: Kerry Hough, PA-C   Results for orders placed or performed during the hospital encounter of 07/04/16 (from the past 24 hour(s))  Wet prep, genital     Status: Abnormal   Collection Time: 07/04/16 11:55 PM  Result Value Ref Range    Yeast Wet Prep HPF POC NONE SEEN NONE SEEN   Trich, Wet Prep NONE SEEN NONE SEEN   Clue Cells Wet Prep HPF POC PRESENT (A) NONE SEEN   WBC, Wet Prep HPF POC MODERATE (A) NONE SEEN   Sperm NONE SEEN   Urinalysis, Routine w reflex microscopic     Status: Abnormal   Collection Time: 07/05/16 12:20 AM  Result Value Ref Range   Color, Urine YELLOW YELLOW   APPearance CLEAR CLEAR   Specific Gravity, Urine >1.030 (H) 1.005 - 1.030   pH 6.0 5.0 - 8.0   Glucose, UA NEGATIVE NEGATIVE mg/dL   Hgb urine dipstick TRACE (A) NEGATIVE   Bilirubin Urine NEGATIVE NEGATIVE   Ketones, ur 15 (A) NEGATIVE mg/dL   Protein, ur NEGATIVE NEGATIVE mg/dL   Nitrite POSITIVE (A) NEGATIVE   Leukocytes, UA TRACE (A) NEGATIVE  Urinalysis, Microscopic (reflex)     Status: Abnormal   Collection Time: 07/05/16 12:20 AM  Result Value Ref Range   RBC / HPF 0-5 0 - 5 RBC/hpf   WBC, UA 0-5 0 - 5 WBC/hpf   Bacteria, UA MANY (A) NONE SEEN   Squamous Epithelial / LPF 0-5 (A) NONE SEEN  Pregnancy, urine POC     Status: Abnormal   Collection Time: 07/05/16 12:23 AM  Result Value Ref Range   Preg Test, Ur POSITIVE (A) NEGATIVE  CBC with Differential/Platelet     Status: Abnormal   Collection Time: 07/05/16 12:25 AM  Result Value Ref Range   WBC 10.4 4.0 - 10.5 K/uL   RBC 3.30 (L) 3.87 - 5.11 MIL/uL   Hemoglobin 10.6 (L) 12.0 - 15.0 g/dL   HCT 31.2 (L) 36.0 - 46.0 %   MCV 94.5 78.0 - 100.0 fL   MCH 32.1 26.0 - 34.0 pg   MCHC 34.0 30.0 - 36.0 g/dL   RDW 14.5 11.5 - 15.5 %   Platelets 292 150 - 400 K/uL   Neutrophils Relative % 88 %   Neutro Abs 9.1 (H) 1.7 - 7.7 K/uL   Lymphocytes Relative 10 %   Lymphs Abs 1.0 0.7 - 4.0 K/uL   Monocytes Relative 2 %   Monocytes Absolute 0.2 0.1 - 1.0 K/uL   Eosinophils Relative 0 %   Eosinophils Absolute 0.0 0.0 - 0.7 K/uL   Basophils Relative 0 %   Basophils Absolute 0.0 0.0 - 0.1 K/uL  ABO/Rh     Status: None (Preliminary result)   Collection Time: 07/05/16 12:25 AM   Result Value Ref Range   ABO/RH(D) O POS    No results found.  MAU Course  Procedures None  MDM +UPT UA, wet prep, GC/chlamydia, CBC, ABO/Rh, quant hCG, HIV, RPR and [place] today to rule out ectopic pregnancy  Assessment and Plan  A: Ruptured left ectopic pregnancy Hemoperitoneum UTI   P: Patient to OR with Dr. Tana Conch, PA-C  07/05/2016, 1:26 AM

## 2016-07-05 ENCOUNTER — Inpatient Hospital Stay (HOSPITAL_COMMUNITY): Payer: Medicaid Other | Admitting: Anesthesiology

## 2016-07-05 ENCOUNTER — Other Ambulatory Visit: Payer: Self-pay | Admitting: Family Medicine

## 2016-07-05 ENCOUNTER — Encounter (HOSPITAL_COMMUNITY)
Admission: AD | Disposition: A | Discharge status: Home / Self Care | Payer: Self-pay | Source: Ambulatory Visit | Attending: Family Medicine

## 2016-07-05 ENCOUNTER — Inpatient Hospital Stay (HOSPITAL_COMMUNITY): Payer: Medicaid Other

## 2016-07-05 ENCOUNTER — Encounter: Payer: Self-pay | Admitting: Medical

## 2016-07-05 DIAGNOSIS — O009 Unspecified ectopic pregnancy without intrauterine pregnancy: Secondary | ICD-10-CM | POA: Diagnosis present

## 2016-07-05 DIAGNOSIS — O00102 Left tubal pregnancy without intrauterine pregnancy: Secondary | ICD-10-CM

## 2016-07-05 HISTORY — PX: LAPAROSCOPIC UNILATERAL SALPINGECTOMY: SHX5934

## 2016-07-05 HISTORY — PX: DIAGNOSTIC LAPAROSCOPY WITH REMOVAL OF ECTOPIC PREGNANCY: SHX6449

## 2016-07-05 LAB — CBC WITH DIFFERENTIAL/PLATELET
BASOS PCT: 0 %
Basophils Absolute: 0 10*3/uL (ref 0.0–0.1)
EOS PCT: 0 %
Eosinophils Absolute: 0 10*3/uL (ref 0.0–0.7)
HEMATOCRIT: 31.2 % — AB (ref 36.0–46.0)
Hemoglobin: 10.6 g/dL — ABNORMAL LOW (ref 12.0–15.0)
Lymphocytes Relative: 10 %
Lymphs Abs: 1 10*3/uL (ref 0.7–4.0)
MCH: 32.1 pg (ref 26.0–34.0)
MCHC: 34 g/dL (ref 30.0–36.0)
MCV: 94.5 fL (ref 78.0–100.0)
MONO ABS: 0.2 10*3/uL (ref 0.1–1.0)
MONOS PCT: 2 %
NEUTROS ABS: 9.1 10*3/uL — AB (ref 1.7–7.7)
Neutrophils Relative %: 88 %
PLATELETS: 292 10*3/uL (ref 150–400)
RBC: 3.3 MIL/uL — ABNORMAL LOW (ref 3.87–5.11)
RDW: 14.5 % (ref 11.5–15.5)
WBC: 10.4 10*3/uL (ref 4.0–10.5)

## 2016-07-05 LAB — URINALYSIS, ROUTINE W REFLEX MICROSCOPIC
Bilirubin Urine: NEGATIVE
GLUCOSE, UA: NEGATIVE mg/dL
KETONES UR: 15 mg/dL — AB
Nitrite: POSITIVE — AB
PH: 6 (ref 5.0–8.0)
PROTEIN: NEGATIVE mg/dL
Specific Gravity, Urine: >1.03 — ABNORMAL HIGH (ref 1.005–1.030)

## 2016-07-05 LAB — HCG, QUANTITATIVE, PREGNANCY: HCG, BETA CHAIN, QUANT, S: 28207 m[IU]/mL — AB (ref <5)

## 2016-07-05 LAB — URINALYSIS, MICROSCOPIC (REFLEX)

## 2016-07-05 LAB — POCT PREGNANCY, URINE: PREG TEST UR: POSITIVE — AB

## 2016-07-05 LAB — WET PREP, GENITAL
SPERM: NONE SEEN
Trich, Wet Prep: NONE SEEN
Yeast Wet Prep HPF POC: NONE SEEN

## 2016-07-05 LAB — HIV ANTIBODY (ROUTINE TESTING W REFLEX): HIV SCREEN 4TH GENERATION: NONREACTIVE

## 2016-07-05 LAB — ABO/RH: ABO/RH(D): O POS

## 2016-07-05 LAB — RPR: RPR: NONREACTIVE

## 2016-07-05 SURGERY — LAPAROSCOPY, WITH ECTOPIC PREGNANCY SURGICAL TREATMENT
Anesthesia: General | Site: Abdomen

## 2016-07-05 MED ORDER — HYDROMORPHONE HCL 1 MG/ML IJ SOLN
0.5000 mg | Freq: Once | INTRAMUSCULAR | Status: AC
  Administered 2016-07-05: 0.5 mg via INTRAVENOUS
  Filled 2016-07-05: qty 1

## 2016-07-05 MED ORDER — SUGAMMADEX SODIUM 200 MG/2ML IV SOLN
INTRAVENOUS | Status: AC
  Filled 2016-07-05: qty 2

## 2016-07-05 MED ORDER — FENTANYL CITRATE (PF) 250 MCG/5ML IJ SOLN
INTRAMUSCULAR | Status: AC
  Filled 2016-07-05: qty 5

## 2016-07-05 MED ORDER — PHENYLEPHRINE HCL 10 MG/ML IJ SOLN
INTRAMUSCULAR | Status: DC | PRN
  Administered 2016-07-05 (×3): 80 mg via INTRAVENOUS

## 2016-07-05 MED ORDER — ROCURONIUM BROMIDE 100 MG/10ML IV SOLN
INTRAVENOUS | Status: AC
  Filled 2016-07-05: qty 1

## 2016-07-05 MED ORDER — MIDAZOLAM HCL 2 MG/2ML IJ SOLN
INTRAMUSCULAR | Status: DC | PRN
  Administered 2016-07-05: 2 mg via INTRAVENOUS

## 2016-07-05 MED ORDER — PROPOFOL 10 MG/ML IV BOLUS
INTRAVENOUS | Status: AC
  Filled 2016-07-05: qty 20

## 2016-07-05 MED ORDER — DEXAMETHASONE SODIUM PHOSPHATE 10 MG/ML IJ SOLN
INTRAMUSCULAR | Status: DC | PRN
  Administered 2016-07-05: 10 mg via INTRAVENOUS

## 2016-07-05 MED ORDER — FENTANYL CITRATE (PF) 100 MCG/2ML IJ SOLN
INTRAMUSCULAR | Status: DC | PRN
  Administered 2016-07-05: 100 ug via INTRAVENOUS
  Administered 2016-07-05: 150 ug via INTRAVENOUS

## 2016-07-05 MED ORDER — ONDANSETRON HCL 4 MG/2ML IJ SOLN
INTRAMUSCULAR | Status: AC
  Filled 2016-07-05: qty 2

## 2016-07-05 MED ORDER — OXYCODONE-ACETAMINOPHEN 5-325 MG PO TABS
ORAL_TABLET | ORAL | Status: AC
  Filled 2016-07-05: qty 1

## 2016-07-05 MED ORDER — KETOROLAC TROMETHAMINE 30 MG/ML IJ SOLN
INTRAMUSCULAR | Status: DC | PRN
  Administered 2016-07-05: 30 mg via INTRAVENOUS

## 2016-07-05 MED ORDER — PHENYLEPHRINE 40 MCG/ML (10ML) SYRINGE FOR IV PUSH (FOR BLOOD PRESSURE SUPPORT)
PREFILLED_SYRINGE | INTRAVENOUS | Status: AC
  Filled 2016-07-05: qty 10

## 2016-07-05 MED ORDER — LACTATED RINGERS IV SOLN
INTRAVENOUS | Status: DC | PRN
  Administered 2016-07-05: 03:00:00 via INTRAVENOUS

## 2016-07-05 MED ORDER — KETOROLAC TROMETHAMINE 30 MG/ML IJ SOLN
INTRAMUSCULAR | Status: AC
  Filled 2016-07-05: qty 1

## 2016-07-05 MED ORDER — ROCURONIUM BROMIDE 100 MG/10ML IV SOLN
INTRAVENOUS | Status: DC | PRN
  Administered 2016-07-05: 20 mg via INTRAVENOUS

## 2016-07-05 MED ORDER — SOD CITRATE-CITRIC ACID 500-334 MG/5ML PO SOLN
30.0000 mL | Freq: Once | ORAL | Status: DC
  Filled 2016-07-05: qty 30

## 2016-07-05 MED ORDER — FAMOTIDINE IN NACL 20-0.9 MG/50ML-% IV SOLN
20.0000 mg | Freq: Once | INTRAVENOUS | Status: DC
  Filled 2016-07-05: qty 50

## 2016-07-05 MED ORDER — ONDANSETRON HCL 4 MG/2ML IJ SOLN
INTRAMUSCULAR | Status: DC | PRN
  Administered 2016-07-05: 4 mg via INTRAVENOUS

## 2016-07-05 MED ORDER — LACTATED RINGERS IV SOLN
INTRAVENOUS | Status: DC
  Administered 2016-07-05 (×2): via INTRAVENOUS

## 2016-07-05 MED ORDER — DEXAMETHASONE SODIUM PHOSPHATE 10 MG/ML IJ SOLN
INTRAMUSCULAR | Status: AC
  Filled 2016-07-05: qty 1

## 2016-07-05 MED ORDER — SUGAMMADEX SODIUM 200 MG/2ML IV SOLN
INTRAVENOUS | Status: DC | PRN
  Administered 2016-07-05: 200 mg via INTRAVENOUS

## 2016-07-05 MED ORDER — LIDOCAINE HCL (CARDIAC) 20 MG/ML IV SOLN
INTRAVENOUS | Status: DC | PRN
  Administered 2016-07-05: 50 mg via INTRAVENOUS

## 2016-07-05 MED ORDER — SUCCINYLCHOLINE CHLORIDE 20 MG/ML IJ SOLN
INTRAMUSCULAR | Status: DC | PRN
  Administered 2016-07-05: 120 mg via INTRAVENOUS

## 2016-07-05 MED ORDER — LIDOCAINE HCL (CARDIAC) 20 MG/ML IV SOLN
INTRAVENOUS | Status: AC
  Filled 2016-07-05: qty 5

## 2016-07-05 MED ORDER — SUCCINYLCHOLINE CHLORIDE 200 MG/10ML IV SOSY
PREFILLED_SYRINGE | INTRAVENOUS | Status: AC
  Filled 2016-07-05: qty 10

## 2016-07-05 MED ORDER — PROPOFOL 10 MG/ML IV BOLUS
INTRAVENOUS | Status: DC | PRN
  Administered 2016-07-05: 100 mg via INTRAVENOUS

## 2016-07-05 MED ORDER — MIDAZOLAM HCL 2 MG/2ML IJ SOLN
INTRAMUSCULAR | Status: AC
  Filled 2016-07-05: qty 2

## 2016-07-05 SURGICAL SUPPLY — 26 items
BLADE SURG 15 STRL LF C SS BP (BLADE) ×2 IMPLANT
BLADE SURG 15 STRL SS (BLADE) ×2
CLOTH BEACON ORANGE TIMEOUT ST (SAFETY) ×4 IMPLANT
DRSG OPSITE POSTOP 3X4 (GAUZE/BANDAGES/DRESSINGS) ×4 IMPLANT
DURAPREP 26ML APPLICATOR (WOUND CARE) ×4 IMPLANT
GLOVE BIOGEL PI IND STRL 7.0 (GLOVE) ×8 IMPLANT
GLOVE BIOGEL PI INDICATOR 7.0 (GLOVE) ×8
GLOVE ECLIPSE 7.0 STRL STRAW (GLOVE) ×4 IMPLANT
GOWN STRL REUS W/TWL LRG LVL3 (GOWN DISPOSABLE) ×12 IMPLANT
PACK LAPAROSCOPY BASIN (CUSTOM PROCEDURE TRAY) ×4 IMPLANT
PACK TRENDGUARD 450 HYBRID PRO (MISCELLANEOUS) ×2 IMPLANT
POUCH SPECIMEN RETRIEVAL 10MM (ENDOMECHANICALS) ×4 IMPLANT
PROTECTOR NERVE ULNAR (MISCELLANEOUS) ×8 IMPLANT
SET IRRIG TUBING LAPAROSCOPIC (IRRIGATION / IRRIGATOR) ×4 IMPLANT
SHEARS HARMONIC ACE PLUS 36CM (ENDOMECHANICALS) IMPLANT
SLEEVE XCEL OPT CAN 5 100 (ENDOMECHANICALS) ×4 IMPLANT
SUT VIC AB 3-0 X1 27 (SUTURE) ×4 IMPLANT
SUT VICRYL 0 UR6 27IN ABS (SUTURE) ×8 IMPLANT
TOWEL OR 17X24 6PK STRL BLUE (TOWEL DISPOSABLE) ×8 IMPLANT
TRAY FOLEY CATH SILVER 14FR (SET/KITS/TRAYS/PACK) ×4 IMPLANT
TRENDGUARD 450 HYBRID PRO PACK (MISCELLANEOUS) ×4
TROCAR BALLN 12MMX100 BLUNT (TROCAR) ×4 IMPLANT
TROCAR XCEL NON-BLD 11X100MML (ENDOMECHANICALS) ×4 IMPLANT
TROCAR XCEL NON-BLD 5MMX100MML (ENDOMECHANICALS) ×4 IMPLANT
WARMER LAPAROSCOPE (MISCELLANEOUS) ×4 IMPLANT
WATER STERILE IRR 1000ML POUR (IV SOLUTION) ×4 IMPLANT

## 2016-07-05 NOTE — Transfer of Care (Signed)
Immediate Anesthesia Transfer of Care Note  Patient: Kimberly Macias  Procedure(s) Performed: Procedure(s): DIAGNOSTIC LAPAROSCOPY WITH REMOVAL OF ECTOPIC PREGNANCY (N/A)  Patient Location: PACU  Anesthesia Type:General  Level of Consciousness: awake, alert , oriented and patient cooperative  Airway & Oxygen Therapy: Patient Spontanous Breathing and Patient connected to nasal cannula oxygen  Post-op Assessment: Report given to RN, Post -op Vital signs reviewed and stable and Patient moving all extremities X 4  Post vital signs: Reviewed and stable  Last Vitals:  Vitals:   07/04/16 2335 07/05/16 0203  BP: 114/67 106/64  Pulse: 75 89  Resp: 18 20  Temp: 36.9 C     Last Pain:  Vitals:   07/05/16 0035  TempSrc:   PainSc: 10-Worst pain ever         Complications: No apparent anesthesia complications

## 2016-07-05 NOTE — Anesthesia Postprocedure Evaluation (Signed)
Anesthesia Post Note  Patient: Kimberly Macias  Procedure(s) Performed: Procedure(s) (LRB): DIAGNOSTIC LAPAROSCOPY WITH REMOVAL OF ECTOPIC PREGNANCY (N/A)  Patient location during evaluation: PACU Anesthesia Type: General Level of consciousness: awake and alert Pain management: pain level controlled Vital Signs Assessment: post-procedure vital signs reviewed and stable Respiratory status: spontaneous breathing, nonlabored ventilation and respiratory function stable Cardiovascular status: blood pressure returned to baseline and stable Postop Assessment: no signs of nausea or vomiting Anesthetic complications: no        Last Vitals:  Vitals:   07/04/16 2335 07/05/16 0203  BP: 114/67 106/64  Pulse: 75 89  Resp: 18 20  Temp: 36.9 C     Last Pain:  Vitals:   07/05/16 0035  TempSrc:   PainSc: 10-Worst pain ever   Pain Goal:                 Lynda Rainwater

## 2016-07-05 NOTE — H&P (Addendum)
Kimberly Macias is an 36 y.o. [phone] female.   Chief Complaint: acute abdominal pain HPI: Patient comes in with acute onset abdominal pain around 8 pm. She also noted spotting. She has had pain for a few days but it got a lot worse tonight. + emesis and nausea. She is feeling dizzy.  No past medical history on file.  No past surgical history on file.  No family history on file. Social History:  reports that she has never smoked. She does not have any smokeless tobacco history on file. She reports that she drinks alcohol. She reports that she does not use drugs.  Allergies: No Known Allergies  No current facility-administered medications on file prior to encounter.    Current Outpatient Prescriptions on File Prior to Encounter  Medication Sig Dispense Refill  . fluconazole (DIFLUCAN) 150 MG tablet Take 1 tablet (150 mg total) by mouth once. Repeat dose in 3 days if needed (Patient not taking: Reported on 06/20/2015) 3 tablet 0  . HYDROcodone-acetaminophen (HYCET) 7.5-325 mg/15 ml solution Take 10 mLs by mouth every 4 (four) hours as needed for moderate pain or severe pain. (Patient not taking: Reported on 06/20/2015) 75 mL 0  . metroNIDAZOLE (FLAGYL) 500 MG tablet Take 4 tablets (2,000 mg total) by mouth once. (Patient not taking: Reported on 06/20/2015) 4 tablet 0  . ondansetron (ZOFRAN) 4 MG tablet Take 1 tablet (4 mg total) by mouth every 6 (six) hours. 12 tablet 0  . oseltamivir (TAMIFLU) 75 MG capsule Take 1 capsule (75 mg total) by mouth every 12 (twelve) hours. 10 capsule 0    A comprehensive review of systems was negative.  Blood pressure 114/67, pulse 75, temperature 98.5 F (36.9 C), temperature source Oral, resp. rate 18, last menstrual period 06/02/2016. General appearance: alert, cooperative and appears stated age Head: Normocephalic, without obvious abnormality, atraumatic Neck: supple, symmetrical, trachea midline Lungs: normal effort Heart: regular rate and rhythm Abdomen:  soft, distended, significant guarding, tenderness and rebound Extremities: Homans sign is negative, no sign of DVT Skin: Skin color, texture, turgor normal. No rashes or lesions Neurologic: Grossly normal    Results for orders placed or performed during the hospital encounter of 07/04/16 (from the past 24 hour(s))  Wet prep, genital     Status: Abnormal   Collection Time: 07/04/16 11:55 PM  Result Value Ref Range   Yeast Wet Prep HPF POC NONE SEEN NONE SEEN   Trich, Wet Prep NONE SEEN NONE SEEN   Clue Cells Wet Prep HPF POC PRESENT (A) NONE SEEN   WBC, Wet Prep HPF POC MODERATE (A) NONE SEEN   Sperm NONE SEEN   Urinalysis, Routine w reflex microscopic     Status: Abnormal   Collection Time: 07/05/16 12:20 AM  Result Value Ref Range   Color, Urine YELLOW YELLOW   APPearance CLEAR CLEAR   Specific Gravity, Urine >1.030 (H) 1.005 - 1.030   pH 6.0 5.0 - 8.0   Glucose, UA NEGATIVE NEGATIVE mg/dL   Hgb urine dipstick TRACE (A) NEGATIVE   Bilirubin Urine NEGATIVE NEGATIVE   Ketones, ur 15 (A) NEGATIVE mg/dL   Protein, ur NEGATIVE NEGATIVE mg/dL   Nitrite POSITIVE (A) NEGATIVE   Leukocytes, UA TRACE (A) NEGATIVE  Urinalysis, Microscopic (reflex)     Status: Abnormal   Collection Time: 07/05/16 12:20 AM  Result Value Ref Range   RBC / HPF 0-5 0 - 5 RBC/hpf   WBC, UA 0-5 0 - 5 WBC/hpf   Bacteria, UA MANY (  A) NONE SEEN   Squamous Epithelial / LPF 0-5 (A) NONE SEEN  Pregnancy, urine POC     Status: Abnormal   Collection Time: 07/05/16 12:23 AM  Result Value Ref Range   Preg Test, Ur POSITIVE (A) NEGATIVE  CBC with Differential/Platelet     Status: Abnormal   Collection Time: 07/05/16 12:25 AM  Result Value Ref Range   WBC 10.4 4.0 - 10.5 K/uL   RBC 3.30 (L) 3.87 - 5.11 MIL/uL   Hemoglobin 10.6 (L) 12.0 - 15.0 g/dL   HCT 31.2 (L) 36.0 - 46.0 %   MCV 94.5 78.0 - 100.0 fL   MCH 32.1 26.0 - 34.0 pg   MCHC 34.0 30.0 - 36.0 g/dL   RDW 14.5 11.5 - 15.5 %   Platelets 292 150 - 400  K/uL   Neutrophils Relative % 88 %   Neutro Abs 9.1 (H) 1.7 - 7.7 K/uL   Lymphocytes Relative 10 %   Lymphs Abs 1.0 0.7 - 4.0 K/uL   Monocytes Relative 2 %   Monocytes Absolute 0.2 0.1 - 1.0 K/uL   Eosinophils Relative 0 %   Eosinophils Absolute 0.0 0.0 - 0.7 K/uL   Basophils Relative 0 %   Basophils Absolute 0.0 0.0 - 0.1 K/uL  ABO/Rh     Status: None (Preliminary result)   Collection Time: 07/05/16 12:25 AM  Result Value Ref Range   ABO/RH(D) O POS   hCG, quantitative, pregnancy     Status: Abnormal   Collection Time: 07/05/16 12:25 AM  Result Value Ref Range   hCG, Beta Chain, Quant, S 28,207 (H) <5 mIU/mL     Assessment/Plan  Principal Problem:   Ruptured ectopic pregnancy  For laparoscopic removal. Risks include but are not limited to bleeding, infection, injury to surrounding structures, including bowel, bladder and ureters, blood clots, and death.  Likelihood of success is high.    Kimberly Macias 07/05/2016, 1:26 AM

## 2016-07-05 NOTE — Op Note (Signed)
PREOPERATIVE DIAGNOSIS: Probable ruptured ectopic pregnancy  POSTOPERATIVE DIAGNOSIS: Same  PROCEDURE: Laparoscopic left salpingectomy   INDICATIONS: 36 y.o. [id number] at [redacted]w[redacted]d here for with ruptured ectopic pregnancy with blood type O pos. Patient was counseled regarding need for laparoscopic salpingectomy. Risks of surgery including bleeding which may require transfusion or reoperation, infection, injury to bowel or other surrounding organs, need for additional procedures including laparotomy and other postoperative/anesthesia complications were explained to patient.  Written informed consent was obtained  FINDINGS: Large amount of hemoperitoneum estimated to be about 500cc of blood and clots.  Dilated left fallopian tube containing ectopic gestation. Small normal appearing uterus, normal right fallopian tube, right ovary and left ovary.  ANESTHESIA: General  SPECIMENS: left fallopian tube to pathology  COMPLICATIONS: None immediate  PROCEDURE IN DETAIL:  The patient was taken to the operating room where general anesthesia was administered and was found to be adequate.  She was placed in the dorsal lithotomy position, and was prepped and draped in a sterile manner.  A Foley catheter was inserted into her bladder and attached to constant drainage and a uterine manipulator was then advanced into the uterus .  After an adequate timeout was performed, attention was then turned to the patient's abdomen where a 10-mm skin incision was made in the umbilical fold. Fascia and peritoneum were entered sharply.  A 0 Vicryl suture was used to tag the fascia circumferentially.  A Hassan trocar was placed. The laparoscope was introduced.  A survey of the patient's pelvis and abdomen revealed the findings as above.  Two left lower quadrant ports were placed under direct visualization; 5-mm x 2.  Attention was then turned to the left fallopian tube which was grasped and ligated from the underlying mesosalpinx and  uterine attachment using the Harmonic instrument.  Good hemostasis was noted.  The specimen was placed in an EndoCatch bag and removed from the abdomen intact. Clot and blood removed with the Nezjhat. The abdomen was desufflated, and all instruments were removed.  The umbilicus incision was closed with the afore mentioned Vicryl suture; and all skin incisions were closed with a 3-0 Vicryl subcuticular stitch followed by DermaBond. Hulka tenaculum removed from the uterus. The patient tolerated the procedure well.  All instruments, needles, and sponge counts were correct x 2. The patient was taken to the recovery room in stable condition.   [name] MD 07/05/2016 9:24 AM

## 2016-07-05 NOTE — Anesthesia Procedure Notes (Signed)
Procedure Name: Intubation Date/Time: 07/05/2016 2:27 AM Performed by: Lyndle Herrlich Pre-anesthesia Checklist: Patient identified, Patient being monitored, Timeout performed, Emergency Drugs available and Suction available Patient Re-evaluated:Patient Re-evaluated prior to inductionOxygen Delivery Method: Circle System Utilized Preoxygenation: Pre-oxygenation with 100% oxygen Intubation Type: IV induction, Rapid sequence and Cricoid Pressure applied Laryngoscope Size: Mac and 3 Grade View: Grade II Tube type: Oral Tube size: 7.0 mm Number of attempts: 1 Airway Equipment and Method: stylet Placement Confirmation: ETT inserted through vocal cords under direct vision,  positive ETCO2 and breath sounds checked- equal and bilateral Secured at: 21 cm Tube secured with: Tape Dental Injury: Teeth and Oropharynx as per pre-operative assessment

## 2016-07-05 NOTE — Anesthesia Preprocedure Evaluation (Signed)
Anesthesia Evaluation  Patient identified by MRN, date of birth, ID band Patient awake    Reviewed: Allergy & Precautions, H&P , Patient's Chart, lab work & pertinent test results, reviewed documented beta blocker date and time   Airway Mallampati: II  TM Distance: >3 FB Neck ROM: full    Dental no notable dental hx.    Pulmonary    Pulmonary exam normal breath sounds clear to auscultation       Cardiovascular Normal cardiovascular exam Rhythm:regular Rate:Normal     Neuro/Psych    GI/Hepatic   Endo/Other    Renal/GU      Musculoskeletal   Abdominal   Peds  Hematology   Anesthesia Other Findings   Reproductive/Obstetrics                             Anesthesia Physical Anesthesia Plan  ASA: II and emergent  Anesthesia Plan: General   Post-op Pain Management:    Induction: Intravenous, Rapid sequence and Cricoid pressure planned  Airway Management Planned: Oral ETT  Additional Equipment:   Intra-op Plan:   Post-operative Plan: Extubation in OR  Informed Consent: I have reviewed the patients History and Physical, chart, labs and discussed the procedure including the risks, benefits and alternatives for the proposed anesthesia with the patient or authorized representative who has indicated his/her understanding and acceptance.   Dental Advisory Given  Plan Discussed with: CRNA and Surgeon  Anesthesia Plan Comments: (  )        Anesthesia Quick Evaluation

## 2016-07-07 LAB — URINE CULTURE: SPECIAL REQUESTS: NORMAL

## 2016-07-07 LAB — GC/CHLAMYDIA PROBE AMP (~~LOC~~) NOT AT ARMC
Chlamydia: NEGATIVE
Neisseria Gonorrhea: NEGATIVE

## 2016-07-08 ENCOUNTER — Encounter (HOSPITAL_COMMUNITY): Payer: Self-pay | Admitting: *Deleted

## 2016-07-08 ENCOUNTER — Inpatient Hospital Stay (HOSPITAL_COMMUNITY)
Admission: AD | Admit: 2016-07-08 | Discharge: 2016-07-08 | Disposition: A | Discharge status: Home / Self Care | Payer: Medicaid Other | Source: Ambulatory Visit | Attending: Family Medicine | Admitting: Family Medicine

## 2016-07-08 ENCOUNTER — Telehealth: Payer: Self-pay | Admitting: Advanced Practice Midwife

## 2016-07-08 ENCOUNTER — Other Ambulatory Visit: Payer: Self-pay | Admitting: Medical

## 2016-07-08 ENCOUNTER — Telehealth: Payer: Self-pay | Admitting: *Deleted

## 2016-07-08 ENCOUNTER — Inpatient Hospital Stay (HOSPITAL_COMMUNITY): Payer: Medicaid Other

## 2016-07-08 DIAGNOSIS — N939 Abnormal uterine and vaginal bleeding, unspecified: Secondary | ICD-10-CM | POA: Insufficient documentation

## 2016-07-08 DIAGNOSIS — N390 Urinary tract infection, site not specified: Principal | ICD-10-CM

## 2016-07-08 DIAGNOSIS — G8918 Other acute postprocedural pain: Secondary | ICD-10-CM | POA: Insufficient documentation

## 2016-07-08 DIAGNOSIS — B952 Enterococcus as the cause of diseases classified elsewhere: Secondary | ICD-10-CM

## 2016-07-08 DIAGNOSIS — K5903 Drug induced constipation: Secondary | ICD-10-CM

## 2016-07-08 DIAGNOSIS — K5909 Other constipation: Secondary | ICD-10-CM | POA: Insufficient documentation

## 2016-07-08 DIAGNOSIS — N3000 Acute cystitis without hematuria: Secondary | ICD-10-CM | POA: Insufficient documentation

## 2016-07-08 LAB — CBC WITH DIFFERENTIAL/PLATELET
BASOS ABS: 0 10*3/uL (ref 0.0–0.1)
Basophils Relative: 1 %
Eosinophils Absolute: 0.2 10*3/uL (ref 0.0–0.7)
Eosinophils Relative: 3 %
HEMATOCRIT: 25.2 % — AB (ref 36.0–46.0)
Hemoglobin: 8.5 g/dL — ABNORMAL LOW (ref 12.0–15.0)
LYMPHS PCT: 29 %
Lymphs Abs: 1.7 10*3/uL (ref 0.7–4.0)
MCH: 31.8 pg (ref 26.0–34.0)
MCHC: 33.7 g/dL (ref 30.0–36.0)
MCV: 94.4 fL (ref 78.0–100.0)
Monocytes Absolute: 0.3 10*3/uL (ref 0.1–1.0)
Monocytes Relative: 5 %
NEUTROS ABS: 3.7 10*3/uL (ref 1.7–7.7)
Neutrophils Relative %: 62 %
PLATELETS: 272 10*3/uL (ref 150–400)
RBC: 2.67 MIL/uL — ABNORMAL LOW (ref 3.87–5.11)
RDW: 13.9 % (ref 11.5–15.5)
WBC: 6 10*3/uL (ref 4.0–10.5)

## 2016-07-08 MED ORDER — LACTATED RINGERS IR SOLN
Status: DC | PRN
  Administered 2016-07-05: 3000 mL

## 2016-07-08 MED ORDER — KETOROLAC TROMETHAMINE 10 MG PO TABS: 10.0000 mg | ORAL_TABLET | Freq: Four times a day (QID) | ORAL | 0 refills | Status: DC | PRN

## 2016-07-08 MED ORDER — FLUCONAZOLE 150 MG PO TABS: 150.0000 mg | ORAL_TABLET | Freq: Once | ORAL | 1 refills | Status: DC | PRN

## 2016-07-08 MED ORDER — MISOPROSTOL 200 MCG PO TABS: 800.0000 ug | ORAL_TABLET | Freq: Once | ORAL | 0 refills | Status: DC

## 2016-07-08 MED ORDER — MISOPROSTOL 200 MCG PO TABS
800.0000 ug | ORAL_TABLET | Freq: Once | ORAL | Status: AC
  Administered 2016-07-08: 800 ug via BUCCAL
  Filled 2016-07-08: qty 4

## 2016-07-08 MED ORDER — OXYCODONE-ACETAMINOPHEN 5-325 MG PO TABS: 1.0000 | ORAL_TABLET | ORAL | 0 refills | Status: DC | PRN

## 2016-07-08 MED ORDER — CIPROFLOXACIN HCL 500 MG PO TABS: 500.0000 mg | ORAL_TABLET | Freq: Two times a day (BID) | ORAL | 0 refills | Status: DC

## 2016-07-08 MED ORDER — NITROFURANTOIN MONOHYD MACRO 100 MG PO CAPS: 100.0000 mg | ORAL_CAPSULE | Freq: Two times a day (BID) | ORAL | 0 refills | Status: DC

## 2016-07-08 MED ORDER — BUPIVACAINE HCL (PF) 0.25 % IJ SOLN
INTRAMUSCULAR | Status: DC | PRN
  Administered 2016-07-05: 8 mL

## 2016-07-08 MED ORDER — POLYETHYLENE GLYCOL 3350 17 GM/SCOOP PO POWD: 17.0000 g | Freq: Every day | ORAL | 1 refills | Status: DC | PRN

## 2016-07-08 MED ORDER — KETOROLAC TROMETHAMINE 10 MG PO TABS
10.0000 mg | ORAL_TABLET | Freq: Once | ORAL | Status: AC
  Administered 2016-07-08: 10 mg via ORAL
  Filled 2016-07-08: qty 1

## 2016-07-08 NOTE — MAU Provider Note (Signed)
Chief Complaint: Vaginal Bleeding   First Provider Initiated Contact with Patient 07/08/16 1617     SUBJECTIVE HPI: Kimberly Macias is a 36 y.o. G4P1021 at 3 days post-op laparoscopic salpingectomy for rupture left ectopic pregnancy who presents to [facility] reporting increased vaginal bleeding and abd pain today. Of note, pt's urine culture was positive for E.coli and Rx was called in for Macrobid today, but pt hasn't picked it up yet.   Location: Low abd Quality: cramping, sharp Severity: 5/10 on pain scale Duration: 3 days Course: worsening Context: Post-op Timing: constant Modifying factors: Minimal improvement w/ 1 percocet and Aleve Associated signs and symptoms: Pos for chills, insomnia, anxiety, VB. Neg for fever, N/V/D, hematuria, dizziness. No BM since before surgery.   History reviewed. No pertinent past medical history. OB History  Gravida Para Term Preterm AB Living  4 1 1  0 2 1  SAB TAB Ectopic Multiple Live Births  0 2 0 0 1    # Outcome Date GA Lbr Len/2nd Weight Sex Delivery Anes PTL Lv  4 Gravida           3 Term 01/13/09    M Vag-Spont EPI  LIV  2 TAB           1 TAB              History reviewed. No pertinent surgical history. Social History   Social History  . Marital status: Single    Spouse name: N/A  . Number of children: N/A  . Years of education: N/A   Occupational History  . Not on file.   Social History Main Topics  . Smoking status: Never Smoker  . Smokeless tobacco: Not on file  . Alcohol use Yes     Comment: occasionally  . Drug use: No  . Sexual activity: Yes    Birth control/ protection: None   Other Topics Concern  . Not on file   Social History Narrative  . No narrative on file   History reviewed. No pertinent family history. No current facility-administered medications on file prior to encounter.    Current Outpatient Prescriptions on File Prior to Encounter  Medication Sig Dispense Refill  . nitrofurantoin,  macrocrystal-monohydrate, (MACROBID) 100 MG capsule Take 1 capsule (100 mg total) by mouth 2 (two) times daily. 14 capsule 0  . ondansetron (ZOFRAN) 4 MG tablet Take 1 tablet (4 mg total) by mouth every 6 (six) hours. (Patient not taking: Reported on 07/08/2016) 12 tablet 0   No Known Allergies  I have reviewed patient's Past Medical Hx, Surgical Hx, Family Hx, Social Hx, medications and allergies.   Review of Systems  Constitutional: Positive for fatigue. Negative for appetite change, chills and fever.  Gastrointestinal: Positive for abdominal pain and constipation. Negative for abdominal distention, diarrhea, nausea and vomiting.       Passing flatus  Genitourinary: Positive for vaginal bleeding. Negative for difficulty urinating, dysuria, flank pain, frequency, hematuria, vaginal discharge and vaginal pain.  Musculoskeletal: Negative for back pain.  Neurological: Negative for dizziness.    OBJECTIVE Patient Vitals for the past 24 hrs:  BP Temp Temp src Pulse Resp  07/08/16 1934 111/70 - - 72 18  07/08/16 1600 (!) 98/55 97.9 F (36.6 C) Oral 76 18   Constitutional: Well-developed, well-nourished female in no acute distress.  Cardiovascular: normal rate Respiratory: normal rate and effort.  GI: Abd soft, mildly distended, mild low abd TTP.  Two LLQ trocar sites healing well, no erythema, drainiage  or swelling. Dermabond in place. Umbilical incision covered by honeycomb dressing. No drainage, bleeding or opening of incision.Small amount of bruising in LLQ. Pos BS x 4 MS: Extremities nontender, no edema, normal ROM Neurologic: Alert and oriented x 4.  GU: Neg CVAT.  SPECULUM EXAM: NEFG, physiologic discharge, moderate amount of bright red, mildly malodorous blood noted (soaked Fox swabs), cervix clean. No injury, polyps or discharge seen.   BIMANUAL: cervix closed; uterus normal size, Mildly , but appropriately tender, no adnexal tenderness or masses. No CMT.  LAB RESULTS Results for  orders placed or performed during the hospital encounter of 07/08/16 (from the past 24 hour(s))  CBC with Differential/Platelet     Status: Abnormal   Collection Time: 07/08/16  4:32 PM  Result Value Ref Range   WBC 6.0 4.0 - 10.5 K/uL   RBC 2.67 (L) 3.87 - 5.11 MIL/uL   Hemoglobin 8.5 (L) 12.0 - 15.0 g/dL   HCT 25.2 (L) 36.0 - 46.0 %   MCV 94.4 78.0 - 100.0 fL   MCH 31.8 26.0 - 34.0 pg   MCHC 33.7 30.0 - 36.0 g/dL   RDW 13.9 11.5 - 15.5 %   Platelets 272 150 - 400 K/uL   Neutrophils Relative % 62 %   Neutro Abs 3.7 1.7 - 7.7 K/uL   Lymphocytes Relative 29 %   Lymphs Abs 1.7 0.7 - 4.0 K/uL   Monocytes Relative 5 %   Monocytes Absolute 0.3 0.1 - 1.0 K/uL   Eosinophils Relative 3 %   Eosinophils Absolute 0.2 0.0 - 0.7 K/uL   Basophils Relative 1 %   Basophils Absolute 0.0 0.0 - 0.1 K/uL    IMAGING US Ob Comp Less 14 Wks  Result Date: 07/05/2016 CLINICAL DATA:  Pain.  Pregnant. EXAM: OBSTETRIC <14 WK ULTRASOUND TECHNIQUE: Transabdominal ultrasound was performed for evaluation of the gestation as well as the maternal uterus and adnexal regions. COMPARISON:  None. FINDINGS: No intrauterine pregnancy is identified. Complex fluid is seen throughout the pelvis, extending into the abdomen consistent with hemorrhage. A 2.2 x 2 x 2 cm solid and cystic mass with internal blood flow is identified in the left side of the pelvis, adjacent to the separate from the left ovary. There is a dominant follicle in the left ovary which is otherwise normal in appearance. IMPRESSION: The findings are consistent with a ruptured left-sided ectopic pregnancy with hemorrhage diffusely throughout the pelvis extending into the abdomen. Results were called by Dr. Gerilyn Nestle to Dr. Kennon Rounds at 1:39 a.m. July 05, 2016 Electronically Signed   By: Dorise Bullion III M.D   On: 07/05/2016 07:50   US Transvaginal Non-ob  Result Date: 07/08/2016 CLINICAL DATA:  Recent left salpingectomy for ectopic pregnancy. Postop vaginal  bleeding with clots. EXAM: ULTRASOUND PELVIS TRANSVAGINAL TECHNIQUE: Transvaginal ultrasound examination of the pelvis was performed including evaluation of the uterus, ovaries, adnexal regions, and pelvic cul-de-sac. COMPARISON:  07/05/2016 FINDINGS: Uterus Measurements: 10.8 x 5.3 x 7.4 cm. No fibroids or other mass visualized. Endometrium Thickness: 4.5 mm.  No focal abnormality visualized. Right ovary Measurements: 2.1 x 0.9 x 1.7 cm. Normal appearance/no adnexal mass. Left ovary Measurements: 2.3 x 2.0 x 2.9 cm. Normal appearance/no adnexal mass. Other findings:  Small amount of free fluid in the pelvis. IMPRESSION: Normal uterus, endometrium and adnexa. No acute finding by ultrasound. Small amount of pelvic free fluid, nonspecific. Electronically Signed   By: Jerilynn Mages.  Shick M.D.   On: 07/08/2016 18:25    MAU COURSE Orders  Placed This Encounter  Procedures  . US Transvaginal Non-OB  . CBC with Differential/Platelet  . Discharge patient   Meds ordered this encounter  Medications  . DISCONTD: oxyCODONE-acetaminophen (PERCOCET/ROXICET) 5-325 MG tablet    Sig: Take 1 tablet by mouth every 4 (four) hours as needed for severe pain.  . misoprostol (CYTOTEC) tablet 800 mcg  . ketorolac (TORADOL) tablet 10 mg  . ketorolac (TORADOL) 10 MG tablet    Sig: Take 1 tablet (10 mg total) by mouth every 6 (six) hours as needed for moderate pain or severe pain.    Dispense:  20 tablet    Refill:  0    Order Specific Question:   Supervising Provider    Answer:   Truett Mainland [[id number]]  . misoprostol (CYTOTEC) 200 MCG tablet    Sig: Take 4 tablets (800 mcg total) by mouth once.    Dispense:  4 tablet    Refill:  0    Order Specific Question:   Supervising Provider    Answer:   Truett Mainland [[id number]]  . oxyCODONE-acetaminophen (PERCOCET/ROXICET) 5-325 MG tablet    Sig: Take 1-2 tablets by mouth every 4 (four) hours as needed for severe pain.    Dispense:  15 tablet    Refill:  0    Order Specific  Question:   Supervising Provider    Answer:   Truett Mainland [[id number]]  . ciprofloxacin (CIPRO) 500 MG tablet    Sig: Take 1 tablet (500 mg total) by mouth 2 (two) times daily.    Dispense:  14 tablet    Refill:  0    Order Specific Question:   Supervising Provider    Answer:   Truett Mainland [[id number]]  . fluconazole (DIFLUCAN) 150 MG tablet    Sig: Take 1 tablet (150 mg total) by mouth once as needed. Can take additional dose three days later if symptoms persist    Dispense:  2 tablet    Refill:  1    Order Specific Question:   Supervising Provider    Answer:   Truett Mainland [[id number]]  . polyethylene glycol powder (GLYCOLAX/MIRALAX) powder    Sig: Take 17 g by mouth daily as needed for moderate constipation.    Dispense:  500 g    Refill:  1    Order Specific Question:   Supervising Provider    Answer:   Truett Mainland [[id number]]    MDM - Increased post-op abd pain most likely combination of constipation and UTI. Does not appear to have a surgical abd, obstruction. Will Rx Miralax, Cipro (better MIC), Toradol and #15 Percocet. F/U in 48 hours in clinic w/ surgeon for reassessment. - Post-op bleeding more than expected, but stable. Discussed Hx, exam, labs, [place] w/ Dr. Nehemiah Settle. Low suspicion for injury or infection from surgery. Recommends Cytotec.  - Pt also displaying grief and anxiety from this pregnancy loss and unexpected emergent surgery. No SI/HI. Has good support. Will have her see [facility] at appt in 2 days. Support given. Pt appreciative.   ASSESSMENT 1. Acute post-operative pain   2. Abnormal vaginal bleeding   3. Drug-induced constipation   4. Acute cystitis without hematuria   5.      Grieving   PLAN Discharge home in stable condition per consult w/ Dr. Nehemiah Settle. Bleeding precautions. Support given Return to MAU for increased pain or bleeding, fever, dizziness, no BM w/in 24 hours or Sx Pyelo.  Follow-up Information  [facility] Follow up on  07/10/2016.   Specialty:  Obstetrics and Gynecology Why:  at 1:40 PM Contact information: [place] [phone]       [facility] Follow up.   Why:  In emergencies Contact information: [address] [id number] [place] [phone]         Allergies as of 07/08/2016   No Known Allergies     Medication List    STOP taking these medications   nitrofurantoin (macrocrystal-monohydrate) 100 MG capsule Commonly known as:  MACROBID     TAKE these medications   ciprofloxacin 500 MG tablet Commonly known as:  CIPRO Take 1 tablet (500 mg total) by mouth 2 (two) times daily.   fluconazole 150 MG tablet Commonly known as:  DIFLUCAN Take 1 tablet (150 mg total) by mouth once as needed. Can take additional dose three days later if symptoms persist   ketorolac 10 MG tablet Commonly known as:  TORADOL Take 1 tablet (10 mg total) by mouth every 6 (six) hours as needed for moderate pain or severe pain.   misoprostol 200 MCG tablet Commonly known as:  CYTOTEC Take 4 tablets (800 mcg total) by mouth once. Start taking on:  07/09/2016   ondansetron 4 MG tablet Commonly known as:  ZOFRAN Take 1 tablet (4 mg total) by mouth every 6 (six) hours.   oxyCODONE-acetaminophen 5-325 MG tablet Commonly known as:  PERCOCET/ROXICET Take 1-2 tablets by mouth every 4 (four) hours as needed for severe pain. What changed:  how much to take   polyethylene glycol powder powder Commonly known as:  GLYCOLAX/MIRALAX Take 17 g by mouth daily as needed for moderate constipation.      [place], [place] 07/08/2016  7:55 PM

## 2016-07-08 NOTE — MAU Note (Signed)
Pt had surgery for ectopic on March 17, presents to MAU today, did not receive DC instructions.  Wasn't bleeding after surgery but started bleeding yesterday, was instructed to come to MAU.  Has changed 2 pads today.  Has incisional soreness.

## 2016-07-08 NOTE — Telephone Encounter (Signed)
Pt called office and spoke with Beronica. She requested a call back from a nurse regarding her after surgery care. I called pt and was unable to speak with pt.  She did not answer and does not have voice mail box set up.

## 2016-07-08 NOTE — Telephone Encounter (Signed)
Called to inform pt of positive urine culture and Rx Macrobid. Pt reported that she had tried to call the hospital because she has been having increased vaginal bleeding and increased abd pain not controlled w/ 1 Percocet tablet Q 4 hours and Aleve. Pain and bleeding were mild for the first two days after delivery, btu she now reports bleeding heavier than a period. Denies fever, chills or dizziness. Consulted w/ Dr. Nehemiah Settle who recommends pt come to MAU for eval.   Manya Silvas, CNM 07/08/2016 1:45 PM

## 2016-07-08 NOTE — Discharge Instructions (Signed)
Diagnostic Laparoscopy, Care After Refer to this sheet in the next few weeks. These instructions provide you with information about caring for yourself after your procedure. Your health care provider may also give you more specific instructions. Your treatment has been planned according to current medical practices, but problems sometimes occur. Call your health care provider if you have any problems or questions after your procedure. What can I expect after the procedure? After your procedure, it is common to have mild discomfort in the throat and abdomen. Follow these instructions at home:  Take over-the-counter and prescription medicines only as told by your health care provider.  Do not drive for 24 hours if you received a sedative.  Return to your normal activities as told by your health care provider.  Do not take baths, swim, or use a hot tub until your health care provider approves. You may shower.  Follow instructions from your health care provider about how to take care of your incision. Make sure you:  Wash your hands with soap and water before you change your bandage (dressing). If soap and water are not available, use hand sanitizer.  Change your dressing as told by your health care provider.  Leave stitches (sutures), skin glue, or adhesive strips in place. These skin closures may need to stay in place for 2 weeks or longer. If adhesive strip edges start to loosen and curl up, you may trim the loose edges. Do not remove adhesive strips completely unless your health care provider tells you to do that.  Check your incision area every day for signs of infection. Check for:  More redness, swelling, or pain.  More fluid or blood.  Warmth.  Pus or a bad smell.  It is your responsibility to get the results of your procedure. Ask your health care provider or the department performing the procedure when your results will be ready. Contact a health care provider if:  There is  new pain in your shoulders.  You feel light-headed or faint.  You are unable to pass gas or unable to have a bowel movement.  You feel nauseous or you vomit.  You develop a rash.  You have more redness, swelling, or pain around your incision.  You have more fluid or blood coming from your incision.  Your incision feels warm to the touch.  You have pus or a bad smell coming from your incision.  You have a fever or chills. Get help right away if:  Your pain is getting worse.  You have ongoing vomiting.  The edges of your incision open up.  You have trouble breathing.  You have chest pain. This information is not intended to replace advice given to you by your health care provider. Make sure you discuss any questions you have with your health care provider. Document Released: 03/19/2015 Document Revised: 09/13/2015 Document Reviewed: 12/19/2014 Elsevier Interactive Patient Education  2017 Elsevier Inc.   Abnormal Uterine Bleeding Abnormal uterine bleeding can affect women at various stages in life, including teenagers, women in their reproductive years, pregnant women, and women who have reached menopause. Several kinds of uterine bleeding are considered abnormal, including:  Bleeding or spotting between periods.  Bleeding after sexual intercourse.  Bleeding that is heavier or more than normal.  Periods that last longer than usual.  Bleeding after menopause. Many cases of abnormal uterine bleeding are minor and simple to treat, while others are more serious. Any type of abnormal bleeding should be evaluated by your health care  provider. Treatment will depend on the cause of the bleeding. Follow these instructions at home: Monitor your condition for any changes. The following actions may help to alleviate any discomfort you are experiencing:  Avoid the use of tampons and douches as directed by your health care provider.  Change your pads frequently. You should get  regular pelvic exams and Pap tests. Keep all follow-up appointments for diagnostic tests as directed by your health care provider. Contact a health care provider if:  Your bleeding lasts more than 1 week.  You feel dizzy at times. Get help right away if:  You pass out.  You are changing pads every 15 to 30 minutes.  You have abdominal pain.  You have a fever.  You become sweaty or weak.  You are passing large blood clots from the vagina.  You start to feel nauseous and vomit. This information is not intended to replace advice given to you by your health care provider. Make sure you discuss any questions you have with your health care provider. Document Released: 04/07/2005 Document Revised: 09/19/2015 Document Reviewed: 11/04/2012 Elsevier Interactive Patient Education  2017 [employer].

## 2016-07-08 NOTE — OR Nursing (Signed)
Epic downtime procedures followed during surgery. Paper chart and pathology sheets completed. Specimen collected 1) Left fallopian tube #[id number] sent to pathology labeled with proper pt sticker and pathology sticker.

## 2016-07-09 ENCOUNTER — Encounter (HOSPITAL_COMMUNITY): Payer: Self-pay | Admitting: Family Medicine

## 2016-07-09 NOTE — Telephone Encounter (Signed)
Per chart review, pt seen in [facility] yesterday for these concerns. No further contact needed.

## 2016-07-10 ENCOUNTER — Telehealth: Payer: Self-pay | Admitting: General Practice

## 2016-07-10 ENCOUNTER — Ambulatory Visit: Payer: Medicaid Other | Admitting: Obstetrics & Gynecology

## 2016-07-10 NOTE — Telephone Encounter (Signed)
Patient no showed for appt today. Called patient, no answer- unable to leave message due to no voicemail set up. Will send letter

## 2016-07-10 NOTE — BH Specialist Note (Deleted)
Integrated Behavioral Health Initial Visit  MRN: [id number] Name: Kimberly Macias   Session Start time: *** Session End time: *** Total time: {IBH Total Time:21014050}  Type of Service: [place] Interpretor:{yes XO:[id number]} Interpretor Name and Language: ***   Warm Hand Off Completed.       SUBJECTIVE: Kimberly Macias is a 36 y.o. female accompanied by {Persons; PED relatives w/patient:19415}. Patient was referred by *** for ***. Patient reports the following symptoms/concerns: *** Duration of problem: ***; Severity of problem: {Mild/Moderate/Severe:20260}  OBJECTIVE: Mood: {BHH MOOD:22306} and Affect: {BHH AFFECT:22307} Risk of harm to self or others: {CHL AMB BH Suicide Current Mental Status:21022748}   LIFE CONTEXT: Family and Social: *** School/Work: *** Self-Care: *** Life Changes: ***  GOALS ADDRESSED: Patient will reduce symptoms of: {IBH Symptoms:21014056} and increase knowledge and/or ability of: {IBH Patient Tools:21014057} and also: {IBH Goals:21014053}   INTERVENTIONS: {IBH Interventions:21014054}  Standardized Assessments completed: {IBH Screening Tools:21014051}  ASSESSMENT: Patient currently experiencing ***. Patient may benefit from ***.  PLAN: 1. Follow up with behavioral health clinician on : *** 2. Behavioral recommendations: *** 3. Referral(s): {IBH Referrals:21014055} 4. "From scale of 1-10, how likely are you to follow plan?": ***  Jacquise Rarick C Nathanial Arrighi, LCSWA

## 2016-07-14 ENCOUNTER — Telehealth: Payer: Self-pay | Admitting: *Deleted

## 2016-07-14 NOTE — Telephone Encounter (Signed)
Received a  voicemessage from [employer]; needing information for disability claim including diagnosis code, next office visit, last visit, when can return to work, etc. They also sent a form, - may fill out form and send back or call back.

## 2016-07-15 NOTE — Telephone Encounter (Signed)
Per chart review, patient has not signed ROI or paid $15 FMLA fee. No FMLA papers have been received from [employer] either. Called patient at listed number, no answer- unable to leave voicemail due to no VM box set up.

## 2016-07-16 ENCOUNTER — Ambulatory Visit (INDEPENDENT_AMBULATORY_CARE_PROVIDER_SITE_OTHER): Payer: Medicaid Other | Admitting: Family Medicine

## 2016-07-16 VITALS — BP 100/60 | HR 69 | Ht 63.5 in | Wt 130.0 lb

## 2016-07-16 DIAGNOSIS — Z09 Encounter for follow-up examination after completed treatment for conditions other than malignant neoplasm: Secondary | ICD-10-CM

## 2016-07-16 DIAGNOSIS — D62 Acute posthemorrhagic anemia: Secondary | ICD-10-CM

## 2016-07-16 MED ORDER — FERROUS SULFATE 325 (65 FE) MG PO TABS: 325.0000 mg | ORAL_TABLET | Freq: Two times a day (BID) | ORAL | 1 refills | Status: DC

## 2016-07-16 NOTE — Progress Notes (Signed)
   Subjective:    Patient ID: Kimberly Macias is a 36 y.o. female presenting with Routine Post Op  on 07/16/2016  HPI: Here today for post op check. She is s/p laparoscopy for ruptured ectopic pregnancy with large hemoperitoneum and removal. Reports some mild continued pain, incisional pain, poor appetite, light headedness and fatigue. Had large hemoperitoneum and blood loss with most recent Hgb 5 days post op at 8.5.  Review of Systems  Constitutional: Negative for chills and fever.  Respiratory: Negative for shortness of breath.   Cardiovascular: Negative for chest pain.  Gastrointestinal: Negative for abdominal pain, nausea and vomiting.  Genitourinary: Negative for dysuria.  Skin: Negative for rash.      Objective:    BP 100/60   Pulse 69   Ht 5' 3.5" (1.613 m)   Wt 130 lb (59 kg)   BMI 22.67 kg/m  Physical Exam  Constitutional: She is oriented to person, place, and time. She appears well-developed and well-nourished. No distress.  HENT:  Head: Normocephalic and atraumatic.  Eyes: No scleral icterus.  Neck: Neck supple.  Cardiovascular: Normal rate.   Pulmonary/Chest: Effort normal.  Abdominal: Soft.  Neurological: She is alert and oriented to person, place, and time.  Skin: Skin is warm and dry.  incisions are well healed and without erythema  Psychiatric: She has a normal mood and affect.        Assessment & Plan:  Postop check - Doing well, recovering nicely, may return to work next week. Advised of normal progression and course.  Acute blood loss anemia - begin iron supplementation - Plan: ferrous sulfate (FERROUSUL) 325 (65 FE) MG tablet   Total face-to-face time with patient: 15 minutes. Over 50% of encounter was spent on counseling and coordination of care. Return if symptoms worsen or fail to improve.  [name] 07/16/2016 2:27 PM

## 2016-07-16 NOTE — Patient Instructions (Signed)
Diagnostic Laparoscopy, Care After  Refer to this sheet in the next few weeks. These instructions provide you with information about caring for yourself after your procedure. Your health care provider may also give you more specific instructions. Your treatment has been planned according to current medical practices, but problems sometimes occur. Call your health care provider if you have any problems or questions after your procedure.  What can I expect after the procedure?  After your procedure, it is common to have mild discomfort in the throat and abdomen.  Follow these instructions at home:  · Take over-the-counter and prescription medicines only as told by your health care provider.  · Do not drive for 24 hours if you received a sedative.  · Return to your normal activities as told by your health care provider.  ·  Do not take baths, swim, or use a hot tub until your health care provider approves. You may shower.  · Follow instructions from your health care provider about how to take care of your incision. Make sure you:  ? Wash your hands with soap and water before you change your bandage (dressing). If soap and water are not available, use hand sanitizer.  ? Change your dressing as told by your health care provider.  ? Leave stitches (sutures), skin glue, or adhesive strips in place. These skin closures may need to stay in place for 2 weeks or longer. If adhesive strip edges start to loosen and curl up, you may trim the loose edges. Do not remove adhesive strips completely unless your health care provider tells you to do that.  · Check your incision area every day for signs of infection. Check for:  ? More redness, swelling, or pain.  ? More fluid or blood.  ? Warmth.  ? Pus or a bad smell.  · It is your responsibility to get the results of your procedure. Ask your health care provider or the department performing the procedure when your results will be ready.  Contact a health care provider if:  · There is  new pain in your shoulders.  · You feel light-headed or faint.  · You are unable to pass gas or unable to have a bowel movement.  · You feel nauseous or you vomit.  · You develop a rash.  · You have more redness, swelling, or pain around your incision.  · You have more fluid or blood coming from your incision.  · Your incision feels warm to the touch.  · You have pus or a bad smell coming from your incision.  · You have a fever or chills.  Get help right away if:  · Your pain is getting worse.  · You have ongoing vomiting.  · The edges of your incision open up.  · You have trouble breathing.  · You have chest pain.  This information is not intended to replace advice given to you by your health care provider. Make sure you discuss any questions you have with your health care provider.  Document Released: 03/19/2015 Document Revised: 09/13/2015 Document Reviewed: 12/19/2014  Elsevier Interactive Patient Education © 2017 Elsevier Inc.

## 2016-07-16 NOTE — Progress Notes (Signed)
Pt reports feeling tired, decrease appetite, and soreness. ALso would like to know if she can wash the incisional area.

## 2016-07-23 ENCOUNTER — Telehealth: Payer: Self-pay | Admitting: *Deleted

## 2016-07-23 NOTE — Telephone Encounter (Signed)
Pt left message yesterday stating that she had surgery last month with Dr. Kennon Rounds. She is now having pain on the Rt side and wants to know if she can be seen by Dr. Kennon Rounds or if a nurse would call her to discuss this.

## 2016-07-24 NOTE — Telephone Encounter (Signed)
Called patient she answer stated she would have to call me back in a couple of minutes.

## 2016-07-25 NOTE — Telephone Encounter (Signed)
Spoke with patient regarding sme rt side pain she was having. Advised her to start taking miralax everyday to help with some constipation she has been having. Patient also stated she odor for her vagina. I advised pt to call back and to schedule appointment.

## 2016-09-30 ENCOUNTER — Ambulatory Visit (INDEPENDENT_AMBULATORY_CARE_PROVIDER_SITE_OTHER): Payer: Self-pay

## 2016-09-30 ENCOUNTER — Encounter: Payer: Self-pay | Admitting: General Practice

## 2016-09-30 ENCOUNTER — Ambulatory Visit: Payer: Self-pay

## 2016-09-30 DIAGNOSIS — Z3200 Encounter for pregnancy test, result unknown: Secondary | ICD-10-CM

## 2016-09-30 DIAGNOSIS — Z3201 Encounter for pregnancy test, result positive: Secondary | ICD-10-CM

## 2016-09-30 NOTE — Progress Notes (Signed)
Patient presented to office today for a pregnancy test. Test confirms she is pregnant around 4 weeks. Medication were reviewed and patient advised to start taking prenatal vitamins. Patient has been given a letter of verification to apply for additional services.

## 2016-10-02 LAB — POCT PREGNANCY, URINE: PREG TEST UR: POSITIVE — AB

## 2016-10-20 ENCOUNTER — Ambulatory Visit: Payer: Self-pay | Admitting: Family Medicine

## 2016-10-20 ENCOUNTER — Encounter: Payer: Self-pay | Admitting: Family Medicine

## 2016-10-20 NOTE — Progress Notes (Unsigned)
Patient did not keep her appointment today.   

## 2017-02-26 ENCOUNTER — Inpatient Hospital Stay (HOSPITAL_COMMUNITY)
Admission: AD | Admit: 2017-02-26 | Discharge: 2017-02-26 | Disposition: A | Discharge status: Home / Self Care | Payer: Self-pay | Source: Ambulatory Visit | Attending: Obstetrics & Gynecology | Admitting: Obstetrics & Gynecology

## 2017-02-26 ENCOUNTER — Inpatient Hospital Stay (HOSPITAL_COMMUNITY): Payer: Self-pay

## 2017-02-26 ENCOUNTER — Encounter (HOSPITAL_COMMUNITY): Payer: Self-pay | Admitting: *Deleted

## 2017-02-26 DIAGNOSIS — Z8759 Personal history of other complications of pregnancy, childbirth and the puerperium: Secondary | ICD-10-CM

## 2017-02-26 DIAGNOSIS — R109 Unspecified abdominal pain: Secondary | ICD-10-CM | POA: Insufficient documentation

## 2017-02-26 DIAGNOSIS — O26891 Other specified pregnancy related conditions, first trimester: Secondary | ICD-10-CM

## 2017-02-26 DIAGNOSIS — Z3A01 Less than 8 weeks gestation of pregnancy: Secondary | ICD-10-CM | POA: Insufficient documentation

## 2017-02-26 DIAGNOSIS — Z3491 Encounter for supervision of normal pregnancy, unspecified, first trimester: Secondary | ICD-10-CM

## 2017-02-26 DIAGNOSIS — O2341 Unspecified infection of urinary tract in pregnancy, first trimester: Secondary | ICD-10-CM | POA: Insufficient documentation

## 2017-02-26 LAB — CBC
HCT: 31.1 % — ABNORMAL LOW (ref 36.0–46.0)
Hemoglobin: 10.3 g/dL — ABNORMAL LOW (ref 12.0–15.0)
MCH: 29.1 pg (ref 26.0–34.0)
MCHC: 33.1 g/dL (ref 30.0–36.0)
MCV: 87.9 fL (ref 78.0–100.0)
PLATELETS: 281 10*3/uL (ref 150–400)
RBC: 3.54 MIL/uL — AB (ref 3.87–5.11)
RDW: 16.1 % — AB (ref 11.5–15.5)
WBC: 5.4 10*3/uL (ref 4.0–10.5)

## 2017-02-26 LAB — URINALYSIS, ROUTINE W REFLEX MICROSCOPIC
BILIRUBIN URINE: NEGATIVE
GLUCOSE, UA: NEGATIVE mg/dL
HGB URINE DIPSTICK: NEGATIVE
KETONES UR: 5 mg/dL — AB
Nitrite: POSITIVE — AB
Protein, ur: NEGATIVE mg/dL
Specific Gravity, Urine: 1.027 (ref 1.005–1.030)
pH: 5 (ref 5.0–8.0)

## 2017-02-26 LAB — WET PREP, GENITAL
Sperm: NONE SEEN
Trich, Wet Prep: NONE SEEN
Yeast Wet Prep HPF POC: NONE SEEN

## 2017-02-26 LAB — HCG, QUANTITATIVE, PREGNANCY: hCG, Beta Chain, Quant, S: 18042 m[IU]/mL — ABNORMAL HIGH (ref <5)

## 2017-02-26 LAB — POCT PREGNANCY, URINE: Preg Test, Ur: POSITIVE — AB

## 2017-02-26 MED ORDER — CEPHALEXIN 500 MG PO CAPS: 500.0000 mg | ORAL_CAPSULE | Freq: Four times a day (QID) | ORAL | 0 refills | Status: DC

## 2017-02-26 MED ORDER — PRENATAL VITAMINS 28-0.8 MG PO TABS: 1.0000 | ORAL_TABLET | Freq: Every day | ORAL | 5 refills | Status: DC

## 2017-02-26 NOTE — MAU Provider Note (Signed)
History     CSN: [id number]  Arrival date & time 02/26/17  1529   First Provider Initiated Contact with Patient 02/26/17 1656      Chief Complaint  Patient presents with  . Abdominal Pain    Pt is a 36 yo G4P1021 at [redacted]w[redacted]d with past hx of ectopic pregnancy who presents with a complaint of pelvic pain. Pt states the pain started 2 days ago. She also reports vaginal discharge, dysuria, foul-swelling urine and "bladder pain". Pt denies vaginal bleeding, N/V, fever, headache, dizziness, or blurry vision. Of note, pt's last ectopic pregnancy was in march of this year.     No past medical history on file.  History reviewed. No pertinent surgical history.  History reviewed. No pertinent family history.  Social History   Tobacco Use  . Smoking status: Never Smoker  . Smokeless tobacco: Never Used  Substance Use Topics  . Alcohol use: Yes    Comment: occasionally  . Drug use: No    OB History    Gravida Para Term Preterm AB Living   4 1 1  0 2 1   SAB TAB Ectopic Multiple Live Births   0 1 1 0 1      Review of Systems  Constitutional: Negative for activity change, appetite change and fever.  HENT: Negative for congestion.   Eyes: Negative for discharge and visual disturbance.  Respiratory: Negative for cough, chest tightness, shortness of breath and wheezing.   Cardiovascular: Negative for chest pain, palpitations and leg swelling.  Gastrointestinal: Negative for abdominal distention, abdominal pain, nausea and vomiting.  Endocrine: Negative for polyuria.  Genitourinary: Positive for dysuria, pelvic pain and vaginal discharge. Negative for difficulty urinating, hematuria and vaginal bleeding.  Musculoskeletal: Positive for back pain. Negative for arthralgias.       Chronic back pain  Skin: Negative for color change and rash.  Neurological: Negative for dizziness and headaches.  Psychiatric/Behavioral: Negative for agitation and confusion.    Allergies  Patient has no  known allergies.  Home Medications    BP (!) 114/57   Pulse 77   Temp 98.7 F (37.1 C)   Resp 18   Ht 5' 3.5" (1.613 m)   Wt 64.4 kg (142 lb)   LMP 01/19/2017   BMI 24.76 kg/m   Physical Exam  Constitutional: She is oriented to person, place, and time. She appears well-developed and well-nourished. No distress.  HENT:  Head: Normocephalic and atraumatic.  Eyes: Conjunctivae are normal. No scleral icterus.  Neck: Normal range of motion. Neck supple.  Cardiovascular: Normal rate, regular rhythm, normal heart sounds and intact distal pulses. Exam reveals no gallop and no friction rub.  No murmur heard. Pulmonary/Chest: Effort normal and breath sounds normal. No respiratory distress. She has no wheezes.  Abdominal: Soft. Bowel sounds are normal. She exhibits no distension and no mass. There is tenderness. There is no rebound and no guarding.  Genitourinary: Vaginal discharge found.  Musculoskeletal: Normal range of motion.  Neurological: She is alert and oriented to person, place, and time.  Skin: Skin is warm and dry. No rash noted.  Psychiatric: She has a normal mood and affect.    MAU Course  Procedures (including critical care time)  Labs Reviewed  CBC - Abnormal; Notable for the following components:      Result Value   RBC 3.54 (*)    Hemoglobin 10.3 (*)    HCT 31.1 (*)    RDW 16.1 (*)    All other  components within normal limits  POCT PREGNANCY, URINE - Abnormal; Notable for the following components:   Preg Test, Ur POSITIVE (*)    All other components within normal limits  WET PREP, GENITAL  URINALYSIS, ROUTINE W REFLEX MICROSCOPIC  HCG, QUANTITATIVE, PREGNANCY  HIV ANTIBODY (ROUTINE TESTING)  GC/CHLAMYDIA PROBE AMP ([place]) NOT AT [facility]   No results found. Results for orders placed or performed during the hospital encounter of 02/26/17 (from the past 24 hour(s))  Urinalysis, Routine w reflex microscopic     Status: Abnormal   Collection Time: 02/26/17   3:37 PM  Result Value Ref Range   Color, Urine YELLOW YELLOW   APPearance HAZY (A) CLEAR   Specific Gravity, Urine 1.027 1.005 - 1.030   pH 5.0 5.0 - 8.0   Glucose, UA NEGATIVE NEGATIVE mg/dL   Hgb urine dipstick NEGATIVE NEGATIVE   Bilirubin Urine NEGATIVE NEGATIVE   Ketones, ur 5 (A) NEGATIVE mg/dL   Protein, ur NEGATIVE NEGATIVE mg/dL   Nitrite POSITIVE (A) NEGATIVE   Leukocytes, UA TRACE (A) NEGATIVE   RBC / HPF 0-5 0 - 5 RBC/hpf   WBC, UA 6-30 0 - 5 WBC/hpf   Bacteria, UA MANY (A) NONE SEEN   Squamous Epithelial / LPF 6-30 (A) NONE SEEN   Mucus PRESENT   Pregnancy, urine POC     Status: Abnormal   Collection Time: 02/26/17  4:13 PM  Result Value Ref Range   Preg Test, Ur POSITIVE (A) NEGATIVE  CBC     Status: Abnormal   Collection Time: 02/26/17  4:42 PM  Result Value Ref Range   WBC 5.4 4.0 - 10.5 K/uL   RBC 3.54 (L) 3.87 - 5.11 MIL/uL   Hemoglobin 10.3 (L) 12.0 - 15.0 g/dL   HCT 31.1 (L) 36.0 - 46.0 %   MCV 87.9 78.0 - 100.0 fL   MCH 29.1 26.0 - 34.0 pg   MCHC 33.1 30.0 - 36.0 g/dL   RDW 16.1 (H) 11.5 - 15.5 %   Platelets 281 150 - 400 K/uL  hCG, quantitative, pregnancy     Status: Abnormal   Collection Time: 02/26/17  4:42 PM  Result Value Ref Range   hCG, Beta Chain, Quant, S 18,042 (H) <5 mIU/mL  Wet prep, genital     Status: Abnormal   Collection Time: 02/26/17  5:44 PM  Result Value Ref Range   Yeast Wet Prep HPF POC NONE SEEN NONE SEEN   Trich, Wet Prep NONE SEEN NONE SEEN   Clue Cells Wet Prep HPF POC PRESENT (A) NONE SEEN   WBC, Wet Prep HPF POC FEW (A) NONE SEEN   Sperm NONE SEEN     US Ob Comp Less 14 Wks  Result Date: 02/26/2017 CLINICAL DATA:  Abdominal pain. First trimester pregnancy. Gestational age by last menstrual period 5 weeks and 3 days. Beta HCG 18,042. EXAM: OBSTETRIC <14 WK [place] AND TRANSVAGINAL OB US TECHNIQUE: Both transabdominal and transvaginal ultrasound examinations were performed for complete evaluation of the gestation as well as  the maternal uterus, adnexal regions, and pelvic cul-de-sac. Transvaginal technique was performed to assess early pregnancy. COMPARISON:  Pelvic ultrasound July 16, 2016 FINDINGS: Intrauterine gestational sac: Present Yolk sac:  Present. Embryo:  Not present Cardiac Activity: Not present MSD: 11  mm   5 w   5  d Subchorionic hemorrhage:  None visualized. Maternal uterus/adnexae: Normal appearance of the adnexae. Small LEFT corpus luteal cyst. IMPRESSION: Early intrauterine gestational and yolk sac, without  fetal pole, or cardiac activity yet visualized. Recommend follow-up quantitative B-HCG levels and follow-up US in 14 days to assess viability. This recommendation follows SRU consensus guidelines: Diagnostic Criteria for Nonviable Pregnancy Early in the First Trimester. [place] [employer] Med 2013; 161:0960-45. Electronically Signed   By: Elon Alas M.D.   On: 02/26/2017 18:39   US Ob Transvaginal  Result Date: 02/26/2017 CLINICAL DATA:  Abdominal pain. First trimester pregnancy. Gestational age by last menstrual period 5 weeks and 3 days. Beta HCG 18,042. EXAM: OBSTETRIC <14 WK [place] AND TRANSVAGINAL OB US TECHNIQUE: Both transabdominal and transvaginal ultrasound examinations were performed for complete evaluation of the gestation as well as the maternal uterus, adnexal regions, and pelvic cul-de-sac. Transvaginal technique was performed to assess early pregnancy. COMPARISON:  Pelvic ultrasound July 16, 2016 FINDINGS: Intrauterine gestational sac: Present Yolk sac:  Present. Embryo:  Not present Cardiac Activity: Not present MSD: 11  mm   5 w   5  d Subchorionic hemorrhage:  None visualized. Maternal uterus/adnexae: Normal appearance of the adnexae. Small LEFT corpus luteal cyst. IMPRESSION: Early intrauterine gestational and yolk sac, without fetal pole, or cardiac activity yet visualized. Recommend follow-up quantitative B-HCG levels and follow-up US in 14 days to assess viability. This recommendation follows  SRU consensus guidelines: Diagnostic Criteria for Nonviable Pregnancy Early in the First Trimester. [place] [employer] Med 2013; 409:8119-14. Electronically Signed   By: Elon Alas M.D.   On: 02/26/2017 18:39    1. Abdominal pain during pregnancy in first trimester   2. Abdominal pain during pregnancy in first trimester       MDM  Pt has hx of ectopic pregnancy and presenting with pelvic pain and discharge  Urine preg test pos. UA pos for UTI, will treat with Keflex. Quant Beta hCG abnormally high (>18000). TV U/S showed gestational sac and yolk sac, ruling out ectopic pregnancy. Will order outpatient viability U/S for pt in 1 week. Will order wet prep and cultures to check for STIs Wet prep with positive clue cells but no other diagnostic criteria so will not treat Plan to discharge pt with prescription for prenatal vitamins.   This visit, including physical exam, was done in my presence and I agree with  the above student's note.   A: 1. Normal IUP (intrauterine pregnancy) on prenatal ultrasound, first trimester   2. Abdominal pain during pregnancy in first trimester   3. UTI (urinary tract infection) during pregnancy, first trimester   4. History of ectopic pregnancy       P: D/C home with first trimester precautions Return to MAU as needed for pain or bleeding [place] in 1 week for viability  Fatima Blank, CNM 9:48 PM

## 2017-02-26 NOTE — MAU Note (Signed)
Pt reports she had a positive HPT. Started having right lower abd pain that started 2 days ago. Denies any vag bleeding or discharge.

## 2017-02-27 LAB — GC/CHLAMYDIA PROBE AMP (~~LOC~~) NOT AT ARMC
Chlamydia: NEGATIVE
Neisseria Gonorrhea: NEGATIVE

## 2017-02-27 LAB — HIV ANTIBODY (ROUTINE TESTING W REFLEX): HIV SCREEN 4TH GENERATION: NONREACTIVE

## 2017-03-05 ENCOUNTER — Ambulatory Visit (INDEPENDENT_AMBULATORY_CARE_PROVIDER_SITE_OTHER): Payer: Self-pay | Admitting: General Practice

## 2017-03-05 ENCOUNTER — Ambulatory Visit (HOSPITAL_COMMUNITY): Payer: Medicaid Other

## 2017-03-05 ENCOUNTER — Ambulatory Visit (HOSPITAL_COMMUNITY)
Admission: RE | Admit: 2017-03-05 | Discharge: 2017-03-05 | Disposition: A | Discharge status: Home / Self Care | Payer: Self-pay | Source: Ambulatory Visit | Attending: Advanced Practice Midwife | Admitting: Advanced Practice Midwife

## 2017-03-05 DIAGNOSIS — R109 Unspecified abdominal pain: Secondary | ICD-10-CM | POA: Insufficient documentation

## 2017-03-05 DIAGNOSIS — Z8759 Personal history of other complications of pregnancy, childbirth and the puerperium: Secondary | ICD-10-CM | POA: Insufficient documentation

## 2017-03-05 DIAGNOSIS — O26891 Other specified pregnancy related conditions, first trimester: Secondary | ICD-10-CM | POA: Insufficient documentation

## 2017-03-05 DIAGNOSIS — O09521 Supervision of elderly multigravida, first trimester: Secondary | ICD-10-CM | POA: Insufficient documentation

## 2017-03-05 DIAGNOSIS — O219 Vomiting of pregnancy, unspecified: Secondary | ICD-10-CM

## 2017-03-05 DIAGNOSIS — Z3A01 Less than 8 weeks gestation of pregnancy: Secondary | ICD-10-CM | POA: Insufficient documentation

## 2017-03-05 MED ORDER — PROMETHAZINE HCL 25 MG PO TABS: 25.0000 mg | ORAL_TABLET | Freq: Four times a day (QID) | ORAL | 0 refills | Status: DC | PRN

## 2017-03-05 NOTE — Progress Notes (Signed)
Patient here for viability results today. Reviewed ultrasound report with Lysbeth Penner, who agrees with viable IUP. Patient should begin prenatal care & vitamins. Informed patient of results, dating information, & provided picture. Patient verbalized understanding. Asked patient if she is taking any medications or vitamins. Patient states she is taking an antibiotic the health department gave her for a bladder infection & PNV. Patient cannot remember the name of the antibiotic but the health department knew she was pregnant. Recommended she begin prenatal care. Patient verbalized understanding & asked for nausea medication. Per Curt Bears, may send in phenergan. Informed patient. Patient verbalized understanding & had no questions

## 2017-03-05 NOTE — Progress Notes (Signed)
Chart reviewed for nurse visit. Agree with plan of care.   Starr [place], [place] 03/05/2017 12:04 PM

## 2017-03-28 ENCOUNTER — Inpatient Hospital Stay (HOSPITAL_COMMUNITY)
Admission: AD | Admit: 2017-03-28 | Discharge: 2017-03-28 | Disposition: A | Discharge status: Home / Self Care | Payer: Self-pay | Source: Ambulatory Visit | Attending: Obstetrics and Gynecology | Admitting: Obstetrics and Gynecology

## 2017-03-28 ENCOUNTER — Other Ambulatory Visit: Payer: Self-pay

## 2017-03-28 ENCOUNTER — Encounter (HOSPITAL_COMMUNITY): Payer: Self-pay

## 2017-03-28 ENCOUNTER — Inpatient Hospital Stay (HOSPITAL_COMMUNITY): Payer: Self-pay

## 2017-03-28 DIAGNOSIS — N939 Abnormal uterine and vaginal bleeding, unspecified: Secondary | ICD-10-CM

## 2017-03-28 DIAGNOSIS — Z3A09 9 weeks gestation of pregnancy: Secondary | ICD-10-CM | POA: Insufficient documentation

## 2017-03-28 DIAGNOSIS — O209 Hemorrhage in early pregnancy, unspecified: Secondary | ICD-10-CM | POA: Insufficient documentation

## 2017-03-28 LAB — URINALYSIS, ROUTINE W REFLEX MICROSCOPIC
BILIRUBIN URINE: NEGATIVE
Glucose, UA: NEGATIVE mg/dL
KETONES UR: NEGATIVE mg/dL
Nitrite: NEGATIVE
PROTEIN: NEGATIVE mg/dL
SPECIFIC GRAVITY, URINE: 1.023 (ref 1.005–1.030)
pH: 5 (ref 5.0–8.0)

## 2017-03-28 LAB — WET PREP, GENITAL
Clue Cells Wet Prep HPF POC: NONE SEEN
SPERM: NONE SEEN
TRICH WET PREP: NONE SEEN
YEAST WET PREP: NONE SEEN

## 2017-03-28 NOTE — MAU Provider Note (Signed)
Patient Kimberly Macias is a 36 y.o. [id number] At [redacted]w[redacted]d here with complaints of vaginal bleeding this morning. She denies strong abdominal pain, abnormal discharge or any other OB-GYN related complaint.  She has had an ectopic pregnancy in the past, and she has had an [place] this pregnancy to see if this pregnancy was ectopic. Ectopic ruled out on 03/05/2017. She has not established prenatal care in this pregnancy yet due to lack of availability of appointments.  History     CSN: [id number]  Arrival date and time: 03/28/17 2119   First Provider Initiated Contact with Patient 03/28/17 0857      Chief Complaint  Patient presents with  . Vaginal Bleeding  . Back Pain   Vaginal Discharge  The patient's primary symptoms include vaginal discharge. This is a new problem. The current episode started today. The problem occurs rarely. The problem has been resolved. The patient is experiencing no pain. Associated symptoms include back pain. Pertinent negatives include no diarrhea, frequency, nausea, urgency or vomiting. The vaginal discharge was bloody (dark brown blood on her pad this morning; after she used teh shower it was just brown colored. Has not had to change her pad or her underwear since changing this morning. ). The vaginal bleeding is spotting. She has been passing clots (one small clot passed). Nothing aggravates the symptoms. She has tried nothing for the symptoms.    OB History    Gravida Para Term Preterm AB Living   4 1 1  0 2 1   SAB TAB Ectopic Multiple Live Births   0 1 1 0 1      History reviewed. No pertinent past medical history.  Past Surgical History:  Procedure Laterality Date  . DIAGNOSTIC LAPAROSCOPY WITH REMOVAL OF ECTOPIC PREGNANCY N/A 07/05/2016   Procedure: DIAGNOSTIC LAPAROSCOPY WITH REMOVAL OF ECTOPIC PREGNANCY;  Surgeon: [name], MD;  Location: [facility];  Service: Gynecology;  Laterality: N/A;  . LAPAROSCOPIC UNILATERAL SALPINGECTOMY Left 07/05/2016   Procedure:  LAPAROSCOPIC UNILATERAL SALPINGECTOMY;  Surgeon: [name], MD;  Location: [facility];  Service: Gynecology;  Laterality: Left;    History reviewed. No pertinent family history.  Social History   Tobacco Use  . Smoking status: Never Smoker  . Smokeless tobacco: Never Used  Substance Use Topics  . Alcohol use: Yes    Comment: occasionally  . Drug use: No    Allergies: No Known Allergies  Medications Prior to Admission  Medication Sig Dispense Refill Last Dose  . Prenatal Vit-Fe Fumarate-FA (PRENATAL VITAMINS) 28-0.8 MG TABS Take 1 tablet daily by mouth. 30 tablet 5 Past Month at Unknown time  . promethazine (PHENERGAN) 25 MG tablet Take 1 tablet (25 mg total) every 6 (six) hours as needed by mouth for nausea or vomiting. 30 tablet 0 Past Week at Unknown time  . cephALEXin (KEFLEX) 500 MG capsule Take 1 capsule (500 mg total) 4 (four) times daily by mouth. (Patient not taking: Reported on 03/28/2017) 28 capsule 0 Completed Course at Unknown time    Review of Systems  Gastrointestinal: Negative for diarrhea, nausea and vomiting.  Genitourinary: Positive for vaginal discharge. Negative for frequency and urgency.  Musculoskeletal: Positive for back pain.   Physical Exam   Blood pressure 121/69, pulse 75, temperature 97.8 F (36.6 C), resp. rate 16, weight 144 lb (65.3 kg), last menstrual period 01/19/2017, unknown if currently breastfeeding.  Physical Exam  Constitutional: She is oriented to person, place, and time. She appears well-developed.  HENT:  Head:  Normocephalic.  Neck: Normal range of motion.  Respiratory: Effort normal.  GI: Soft.  Neurological: She is alert and oriented to person, place, and time.  Skin: Skin is warm and dry.  Psychiatric: She has a normal mood and affect.    MAU Course  Procedures  MDM -K[place] for bleeding and viability -GC CT pending, wet prep -wet prep normal; US shows SIUP with cardiac activity of 180.   Assessment and Plan   1. Vaginal  bleeding   2. Vaginal bleeding    2. Patient stable for discharge; reviewed warning signs and when to return to the MAU.  3. Number for [facility] and [facility] clinics given so patient can start prenatal care.  4. Patient verbalized understanding; all questions answered.   Mervyn Skeeters Kooistra 03/28/2017, 9:20 AM

## 2017-03-28 NOTE — Discharge Instructions (Signed)
[facility]:  [address]  [place], [place] [address] [phone];   [facility]  [place] [phone]  Pelvic Rest Pelvic rest may be recommended if:  Your placenta is partially or completely covering the opening of your cervix (placenta previa).  There is bleeding between the wall of the uterus and the amniotic sac in the first trimester of pregnancy (subchorionic hemorrhage).  You went into labor too early (preterm labor).  Based on your overall health and the health of your baby, your health care provider will decide if pelvic rest is right for you. How do I rest my pelvis? For as long as told by your health care provider:  Do not have sex, sexual stimulation, or an orgasm.  Do not use tampons. Do not douche. Do not put anything in your vagina.  Do not lift anything that is heavier than 10 lb (4.5 kg).  Avoid activities that take a lot of effort (are strenuous).  Avoid any activity in which your pelvic muscles could become strained.  When should I seek medical care? Seek medical care if you have:  Cramping pain in your lower abdomen.  Vaginal discharge.  A low, dull backache.  Regular contractions.  Uterine tightening.  When should I seek immediate medical care? Seek immediate medical care if:  You have vaginal bleeding and you are pregnant.  This information is not intended to replace advice given to you by your health care provider. Make sure you discuss any questions you have with your health care provider. Document Released: 08/02/2010 Document Revised: 09/13/2015 Document Reviewed: 10/09/2014 Elsevier Interactive Patient Education  [employer].

## 2017-03-28 NOTE — MAU Note (Signed)
Lower back pain started this morning, had dark blood this morning upon awakening.

## 2017-03-31 LAB — GC/CHLAMYDIA PROBE AMP (~~LOC~~) NOT AT ARMC
Chlamydia: NEGATIVE
NEISSERIA GONORRHEA: NEGATIVE

## 2017-04-02 ENCOUNTER — Encounter: Payer: Self-pay | Admitting: Student

## 2017-04-15 ENCOUNTER — Emergency Department (HOSPITAL_COMMUNITY)
Admission: EM | Admit: 2017-04-15 | Discharge: 2017-04-15 | Disposition: A | Discharge status: Home / Self Care | Payer: No Typology Code available for payment source | Attending: Emergency Medicine | Admitting: Emergency Medicine

## 2017-04-15 ENCOUNTER — Encounter (HOSPITAL_COMMUNITY): Payer: Self-pay | Admitting: Family Medicine

## 2017-04-15 DIAGNOSIS — M62830 Muscle spasm of back: Secondary | ICD-10-CM | POA: Insufficient documentation

## 2017-04-15 DIAGNOSIS — M545 Low back pain: Secondary | ICD-10-CM | POA: Diagnosis present

## 2017-04-15 MED ORDER — CYCLOBENZAPRINE HCL 5 MG PO TABS: 5.0000 mg | ORAL_TABLET | Freq: Two times a day (BID) | ORAL | 0 refills | Status: DC | PRN

## 2017-04-15 MED ORDER — CYCLOBENZAPRINE HCL 10 MG PO TABS
10.0000 mg | ORAL_TABLET | Freq: Once | ORAL | Status: AC
  Administered 2017-04-15: 10 mg via ORAL
  Filled 2017-04-15: qty 1

## 2017-04-15 MED ORDER — ACETAMINOPHEN 325 MG PO TABS
650.0000 mg | ORAL_TABLET | Freq: Once | ORAL | Status: AC
  Administered 2017-04-15: 650 mg via ORAL
  Filled 2017-04-15: qty 2

## 2017-04-15 NOTE — ED Notes (Signed)
Fetal heart tones were present to the right lower abd. Rate: 155
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
## 2017-04-15 NOTE — Discharge Instructions (Signed)
Take tylenol in addition to the muscle relaxer. Do not take the muscle relaxer if driving as it will make you sleepy. Follow up with your OB. Return here as needed.

## 2017-04-21 ENCOUNTER — Encounter: Payer: Self-pay | Admitting: Student

## 2017-04-21 NOTE — L&D Delivery Note (Signed)
Delivery Note  Kimberly Macias is a 37 yo G4 now P2022 s/p SVD at [redacted]w[redacted]d after presenting in spontaneous labor. SROM occurred at 0800 while waiting for labor check in MAU. She arrived on the floor and progressed well with augmentation with low dose of pitocin. She pushed for approximately 35 minutes before delivery as below.   At 12:58 PM a viable female was delivered via Vaginal, Spontaneous (Presentation: direct OA).  APGAR: , ; weight  Pending. After delivery attention was turned to active management of the third stage of labor with gentle cord traction via Brandt maneuver.    Placenta status: delivered spontaneously, intact, 3-vessel cord.    Anesthesia:  epidural Episiotomy: None Lacerations:  None however significant right sided labial swelling measuring 6 cm in diameter ice immediately given PP Suture Repair: n/a Est. Blood Loss (mL):  200 ML  Mom to postpartum.  Baby to Couplet care / Skin to Skin.  [place] 10/07/2017, 1:15 PM

## 2017-04-22 ENCOUNTER — Ambulatory Visit (INDEPENDENT_AMBULATORY_CARE_PROVIDER_SITE_OTHER): Payer: Self-pay | Admitting: Student

## 2017-04-22 ENCOUNTER — Encounter: Payer: Self-pay | Admitting: Student

## 2017-04-22 ENCOUNTER — Other Ambulatory Visit (HOSPITAL_COMMUNITY)
Admission: RE | Admit: 2017-04-22 | Discharge: 2017-04-22 | Disposition: A | Discharge status: Home / Self Care | Payer: Medicaid Other | Source: Ambulatory Visit | Attending: Student | Admitting: Student

## 2017-04-22 VITALS — BP 100/64 | HR 80 | Wt 139.0 lb

## 2017-04-22 DIAGNOSIS — O09521 Supervision of elderly multigravida, first trimester: Secondary | ICD-10-CM | POA: Insufficient documentation

## 2017-04-22 DIAGNOSIS — Z3480 Encounter for supervision of other normal pregnancy, unspecified trimester: Secondary | ICD-10-CM | POA: Insufficient documentation

## 2017-04-22 DIAGNOSIS — Z8632 Personal history of gestational diabetes: Secondary | ICD-10-CM

## 2017-04-22 DIAGNOSIS — Z3481 Encounter for supervision of other normal pregnancy, first trimester: Secondary | ICD-10-CM

## 2017-04-22 DIAGNOSIS — Z3A00 Weeks of gestation of pregnancy not specified: Secondary | ICD-10-CM | POA: Insufficient documentation

## 2017-04-22 DIAGNOSIS — Z23 Encounter for immunization: Secondary | ICD-10-CM | POA: Diagnosis not present

## 2017-04-22 DIAGNOSIS — O219 Vomiting of pregnancy, unspecified: Secondary | ICD-10-CM

## 2017-04-22 LAB — POCT URINALYSIS DIP (DEVICE)
Bilirubin Urine: NEGATIVE
GLUCOSE, UA: NEGATIVE mg/dL
KETONES UR: NEGATIVE mg/dL
Nitrite: NEGATIVE
PH: 6 (ref 5.0–8.0)
PROTEIN: NEGATIVE mg/dL
UROBILINOGEN UA: 1 mg/dL (ref 0.0–1.0)

## 2017-04-22 MED ORDER — METOCLOPRAMIDE HCL 10 MG PO TABS: 10.0000 mg | ORAL_TABLET | Freq: Three times a day (TID) | ORAL | 0 refills | Status: DC | PRN

## 2017-04-22 MED ORDER — PRENATAL ADULT GUMMY/DHA/FA 0.4-25 MG PO CHEW: 1.0000 | CHEWABLE_TABLET | Freq: Every day | ORAL | 8 refills | Status: DC

## 2017-04-22 NOTE — Patient Instructions (Signed)
First Trimester of Pregnancy The first trimester of pregnancy is from week 1 until the end of week 13 (months 1 through 3). During this time, your baby will begin to develop inside you. At 6-8 weeks, the eyes and face are formed, and the heartbeat can be seen on ultrasound. At the end of 12 weeks, all the baby's organs are formed. Prenatal care is all the medical care you receive before the birth of your baby. Make sure you get good prenatal care and follow all of your doctor's instructions. Follow these instructions at home: Medicines  Take over-the-counter and prescription medicines only as told by your doctor. Some medicines are safe and some medicines are not safe during pregnancy.  Take a prenatal vitamin that contains at least 600 micrograms (mcg) of folic acid.  If you have trouble pooping (constipation), take medicine that will make your stool soft (stool softener) if your doctor approves. Eating and drinking  Eat regular, healthy meals.  Your doctor will tell you the amount of weight gain that is right for you.  Avoid raw meat and uncooked cheese.  If you feel sick to your stomach (nauseous) or throw up (vomit): ? Eat 4 or 5 small meals a day instead of 3 large meals. ? Try eating a few soda crackers. ? Drink liquids between meals instead of during meals.  To prevent constipation: ? Eat foods that are high in fiber, like fresh fruits and vegetables, whole grains, and beans. ? Drink enough fluids to keep your pee (urine) clear or pale yellow. Activity  Exercise only as told by your doctor. Stop exercising if you have cramps or pain in your lower belly (abdomen) or low back.  Do not exercise if it is too hot, too humid, or if you are in a place of great height (high altitude).  Try to avoid standing for long periods of time. Move your legs often if you must stand in one place for a long time.  Avoid heavy lifting.  Wear low-heeled shoes. Sit and stand up straight.  You  can have sex unless your doctor tells you not to. Relieving pain and discomfort  Wear a good support bra if your breasts are sore.  Take warm water baths (sitz baths) to soothe pain or discomfort caused by hemorrhoids. Use hemorrhoid cream if your doctor says it is okay.  Rest with your legs raised if you have leg cramps or low back pain.  If you have puffy, bulging veins (varicose veins) in your legs: ? Wear support hose or compression stockings as told by your doctor. ? Raise (elevate) your feet for 15 minutes, 3-4 times a day. ? Limit salt in your food. Prenatal care  Schedule your prenatal visits by the twelfth week of pregnancy.  Write down your questions. Take them to your prenatal visits.  Keep all your prenatal visits as told by your doctor. This is important. Safety  Wear your seat belt at all times when driving.  Make a list of emergency phone numbers. The list should include numbers for family, friends, the hospital, and police and fire departments. General instructions  Ask your doctor for a referral to a local prenatal class. Begin classes no later than at the start of month 6 of your pregnancy.  Ask for help if you need counseling or if you need help with nutrition. Your doctor can give you advice or tell you where to go for help.  Do not use hot tubs, steam rooms, or   saunas.  Do not douche or use tampons or scented sanitary pads.  Do not cross your legs for long periods of time.  Avoid all herbs and alcohol. Avoid drugs that are not approved by your doctor.  Do not use any tobacco products, including cigarettes, chewing tobacco, and electronic cigarettes. If you need help quitting, ask your doctor. You may get counseling or other support to help you quit.  Avoid cat litter boxes and soil used by cats. These carry germs that can cause birth defects in the baby and can cause a loss of your baby (miscarriage) or stillbirth.  Visit your dentist. At home, brush  your teeth with a soft toothbrush. Be gentle when you floss. Contact a doctor if:  You are dizzy.  You have mild cramps or pressure in your lower belly.  You have a nagging pain in your belly area.  You continue to feel sick to your stomach, you throw up, or you have watery poop (diarrhea).  You have a bad smelling fluid coming from your vagina.  You have pain when you pee (urinate).  You have increased puffiness (swelling) in your face, hands, legs, or ankles. Get help right away if:  You have a fever.  You are leaking fluid from your vagina.  You have spotting or bleeding from your vagina.  You have very bad belly cramping or pain.  You gain or lose weight rapidly.  You throw up blood. It may look like coffee grounds.  You are around people who have German measles, fifth disease, or chickenpox.  You have a very bad headache.  You have shortness of breath.  You have any kind of trauma, such as from a fall or a car accident. Summary  The first trimester of pregnancy is from week 1 until the end of week 13 (months 1 through 3).  To take care of yourself and your unborn baby, you will need to eat healthy meals, take medicines only if your doctor tells you to do so, and do activities that are safe for you and your baby.  Keep all follow-up visits as told by your doctor. This is important as your doctor will have to ensure that your baby is healthy and growing well. This information is not intended to replace advice given to you by your health care provider. Make sure you discuss any questions you have with your health care provider. Document Released: 09/24/2007 Document Revised: 04/15/2016 Document Reviewed: 04/15/2016 Elsevier Interactive Patient Education  2017 [employer]  

## 2017-04-22 NOTE — Progress Notes (Signed)
Subjective:    Kimberly Macias is a [id number] [redacted]w[redacted]d being seen today for her first obstetrical visit.  Her obstetrical history is significant for advanced maternal age and hx of GDM in previous pregnancy. Patient does intend to breast feed. Pregnancy history fully reviewed. Reports diet controlled GDM in previous pregnancy. Had TSVD in 2010 in [place].  Patient reports nausea. Does not have antiemetic at home & is requesting prescription.   Vitals:   04/22/17 0915  BP: 100/64  Pulse: 80  Weight: 139 lb (63 kg)    HISTORY: OB History  Gravida Para Term Preterm AB Living  4 1 1  0 2 1  SAB TAB Ectopic Multiple Live Births  0 1 1 0 1    # Outcome Date GA Lbr Len/2nd Weight Sex Delivery Anes PTL Lv  4 Current           3 Ectopic 07/05/16 [redacted]w[redacted]d         2 Term 01/13/09    M Vag-Spont EPI  LIV  1 TAB              Past Medical History:  Diagnosis Date  . Gestational diabetes    Past Surgical History:  Procedure Laterality Date  . DIAGNOSTIC LAPAROSCOPY WITH REMOVAL OF ECTOPIC PREGNANCY N/A 07/05/2016   Procedure: DIAGNOSTIC LAPAROSCOPY WITH REMOVAL OF ECTOPIC PREGNANCY;  Surgeon: [name], MD;  Location: [facility];  Service: Gynecology;  Laterality: N/A;  . LAPAROSCOPIC UNILATERAL SALPINGECTOMY Left 07/05/2016   Procedure: LAPAROSCOPIC UNILATERAL SALPINGECTOMY;  Surgeon: [name], MD;  Location: [facility];  Service: Gynecology;  Laterality: Left;   History reviewed. No pertinent family history.   Exam    Uterus:   Enlarged 12-14 wks. Non tender.   Pelvic Exam:    Perineum: No Hemorrhoids   Vulva: normal   Vagina:  normal mucosa, normal discharge   Cervix: no bleeding following Pap, no cervical motion tenderness, no lesions and Cervix closed  System: Breast:  normal appearance, no masses or tenderness, Inspection negative, bilateral piercings present   Skin: normal coloration and turgor, no rashes    Neurologic: oriented, normal, normal mood   Mouth/Teeth dental  hygiene good   Neck supple and no masses   Cardiovascular: regular rate and rhythm, no murmurs or gallops   Respiratory:  appears well, vitals normal, no respiratory distress, acyanotic, normal RR, chest clear, no wheezing, crepitations, rhonchi, normal symmetric air entry   Abdomen: soft, non-tender; bowel sounds normal; no masses,  no organomegaly      Assessment:    Pregnancy: [id number] Patient Active Problem List   Diagnosis Date Noted  . Supervision of other normal pregnancy, antepartum 04/22/2017  . History of gestational diabetes 04/22/2017  . AMA (advanced maternal age) multigravida 50+, first trimester 04/22/2017  . Ruptured ectopic pregnancy 07/05/2016        Plan:  1. Supervision of other normal pregnancy, antepartum  - Cytology - PAP - Culture, OB Urine - HgB A1c - Hemoglobinopathy evaluation - Obstetric Panel, Including HIV - Flu Vaccine QUAD 36+ mos IM - [place] MFM OB DETAIL +14 WK; Future - Ambulatory referral to Genetics - Babyscripts Schedule Optimization - Prenatal MV & Min w/FA-DHA (PRENATAL ADULT GUMMY/DHA/FA) 0.4-25 MG CHEW; Chew 1 Piece by mouth daily.  Dispense: 30 tablet; Refill: 8  2. History of gestational diabetes  - HgB A1c  3. Nausea and vomiting during pregnancy prior to [redacted] weeks gestation  - metoCLOPramide (REGLAN) 10 MG tablet; Take 1  tablet (10 mg total) by mouth every 8 (eight) hours as needed for nausea.  Dispense: 30 tablet; Refill: 0  4. AMA (advanced maternal age) multigravida 15+, first trimester -Genetic counseling scheduled    Initial labs drawn. Prenatal vitamins. Problem list reviewed and updated. Genetic Screening discussed: interested in [employer]. Will order at next visit when insurance active.   Ultrasound discussed; fetal survey: ordered.  Follow up for 20 wk visit per Babyscripts optimization   Jorje Guild 04/22/2017

## 2017-04-23 LAB — CYTOLOGY - PAP
ADEQUACY: ABSENT
DIAGNOSIS: NEGATIVE
HPV: NOT DETECTED

## 2017-04-24 LAB — OBSTETRIC PANEL, INCLUDING HIV
Antibody Screen: NEGATIVE
BASOS ABS: 0 10*3/uL (ref 0.0–0.2)
Basos: 0 %
EOS (ABSOLUTE): 0.1 10*3/uL (ref 0.0–0.4)
Eos: 2 %
HEP B S AG: NEGATIVE
HIV SCREEN 4TH GENERATION: NONREACTIVE
Hematocrit: 32.1 % — ABNORMAL LOW (ref 34.0–46.6)
Hemoglobin: 10.4 g/dL — ABNORMAL LOW (ref 11.1–15.9)
IMMATURE GRANULOCYTES: 0 %
Immature Grans (Abs): 0 10*3/uL (ref 0.0–0.1)
LYMPHS ABS: 1.4 10*3/uL (ref 0.7–3.1)
Lymphs: 32 %
MCH: 28.5 pg (ref 26.6–33.0)
MCHC: 32.4 g/dL (ref 31.5–35.7)
MCV: 88 fL (ref 79–97)
Monocytes Absolute: 0.3 10*3/uL (ref 0.1–0.9)
Monocytes: 6 %
NEUTROS ABS: 2.8 10*3/uL (ref 1.4–7.0)
NEUTROS PCT: 60 %
PLATELETS: 294 10*3/uL (ref 150–379)
RBC: 3.65 x10E6/uL — ABNORMAL LOW (ref 3.77–5.28)
RDW: 16.6 % — ABNORMAL HIGH (ref 12.3–15.4)
RPR Ser Ql: NONREACTIVE
Rh Factor: POSITIVE
Rubella Antibodies, IGG: 2.27 index (ref >0.99)
WBC: 4.5 10*3/uL (ref 3.4–10.8)

## 2017-04-24 LAB — CULTURE, OB URINE

## 2017-04-24 LAB — HEMOGLOBINOPATHY EVALUATION
HEMOGLOBIN A2 QUANTITATION: 2.3 % (ref 1.8–3.2)
HEMOGLOBIN F QUANTITATION: 0 % (ref 0.0–2.0)
HGB A: 97.7 % (ref 96.4–98.8)
HGB C: 0 %
HGB S: 0 %
HGB VARIANT: 0 %

## 2017-04-24 LAB — HEMOGLOBIN A1C
Est. average glucose Bld gHb Est-mCnc: 105 mg/dL
Hgb A1c MFr Bld: 5.3 % (ref 4.8–5.6)

## 2017-04-24 LAB — URINE CULTURE, OB REFLEX

## 2017-04-28 ENCOUNTER — Encounter: Payer: Self-pay | Admitting: Student

## 2017-05-26 ENCOUNTER — Encounter: Payer: Self-pay | Admitting: Student

## 2017-05-28 ENCOUNTER — Other Ambulatory Visit: Payer: Self-pay | Admitting: General Practice

## 2017-05-28 ENCOUNTER — Encounter: Payer: Self-pay | Admitting: Student

## 2017-05-28 DIAGNOSIS — B379 Candidiasis, unspecified: Secondary | ICD-10-CM

## 2017-05-28 MED ORDER — TERCONAZOLE 0.4 % VA CREA: 1.0000 | TOPICAL_CREAM | Freq: Every day | VAGINAL | 0 refills | Status: AC

## 2017-06-03 ENCOUNTER — Encounter (HOSPITAL_COMMUNITY): Payer: Self-pay | Admitting: Student

## 2017-06-09 ENCOUNTER — Ambulatory Visit (HOSPITAL_COMMUNITY)
Admission: RE | Admit: 2017-06-09 | Discharge: 2017-06-09 | Disposition: A | Discharge status: Home / Self Care | Payer: Medicaid Other | Source: Ambulatory Visit | Attending: Student | Admitting: Student

## 2017-06-09 ENCOUNTER — Other Ambulatory Visit: Payer: Self-pay | Admitting: Student

## 2017-06-09 ENCOUNTER — Ambulatory Visit (INDEPENDENT_AMBULATORY_CARE_PROVIDER_SITE_OTHER): Payer: Medicaid Other | Admitting: Obstetrics and Gynecology

## 2017-06-09 ENCOUNTER — Other Ambulatory Visit: Payer: Self-pay

## 2017-06-09 ENCOUNTER — Encounter (HOSPITAL_COMMUNITY): Payer: Self-pay

## 2017-06-09 VITALS — BP 105/58 | HR 80 | Wt 143.2 lb

## 2017-06-09 DIAGNOSIS — O09521 Supervision of elderly multigravida, first trimester: Secondary | ICD-10-CM

## 2017-06-09 DIAGNOSIS — O09522 Supervision of elderly multigravida, second trimester: Secondary | ICD-10-CM | POA: Diagnosis not present

## 2017-06-09 DIAGNOSIS — Z3480 Encounter for supervision of other normal pregnancy, unspecified trimester: Secondary | ICD-10-CM

## 2017-06-09 DIAGNOSIS — Z8632 Personal history of gestational diabetes: Secondary | ICD-10-CM

## 2017-06-09 DIAGNOSIS — Z3A20 20 weeks gestation of pregnancy: Secondary | ICD-10-CM | POA: Diagnosis not present

## 2017-06-09 DIAGNOSIS — Z3689 Encounter for other specified antenatal screening: Secondary | ICD-10-CM

## 2017-06-09 DIAGNOSIS — O09299 Supervision of pregnancy with other poor reproductive or obstetric history, unspecified trimester: Secondary | ICD-10-CM

## 2017-06-09 DIAGNOSIS — Z3482 Encounter for supervision of other normal pregnancy, second trimester: Secondary | ICD-10-CM

## 2017-06-09 NOTE — Progress Notes (Signed)
Subjective:  Kimberly Macias is a 37 y.o. G4P1021 at [redacted]w[redacted]d being seen today for ongoing prenatal care.  She is currently monitored for the following issues for this low-risk pregnancy and has Supervision of other normal pregnancy, antepartum; History of gestational diabetes; and AMA (advanced maternal age) multigravida 45+, first trimester on their problem list.  Patient reports no complaints.  Contractions: Not present. Vag. Bleeding: None.  Movement: Present. Denies leaking of fluid.   The following portions of the patient's history were reviewed and updated as appropriate: allergies, current medications, past family history, past medical history, past social history, past surgical history and problem list. Problem list updated.  Objective:   Vitals:   06/09/17 1317  BP: (!) 105/58  Pulse: 80  Weight: 143 lb 3.2 oz (65 kg)    Fetal Status: Fetal Heart Rate (bpm): 165 Fundal Height: 20 cm Movement: Present     General:  Alert, oriented and cooperative. Patient is in no acute distress.  Skin: Skin is warm and dry. No rash noted.   Cardiovascular: Normal heart rate noted  Respiratory: Normal respiratory effort, no problems with respiration noted  Abdomen: Soft, gravid, appropriate for gestational age. Pain/Pressure: Absent     Pelvic: Vag. Bleeding: None     Cervical exam deferred        Extremities: Normal range of motion.  Edema: None  Mental Status: Normal mood and affect. Normal behavior. Normal judgment and thought content.   Urinalysis:      Assessment and Plan:  Pregnancy: G4P1021 at [redacted]w[redacted]d  1. Supervision of other normal pregnancy, antepartum Doing well. Continue routine care. Anatomy US preformed today was normal.   2. History of gestational diabetes A1c 5.3 at initial prenatal visit. Plan for GTT at 28wks  3. AMA (advanced maternal age) multigravida 45+, first trimester Genetic testing done by MFM at [place] today.   Preterm labor symptoms and general obstetric precautions  including but not limited to vaginal bleeding, contractions, leaking of fluid and fetal movement were reviewed in detail with the patient. Please refer to After Visit Summary for other counseling recommendations.  Return in about 4 weeks (around 07/07/2017) for ob visit.   Katheren Shams, DO

## 2017-06-09 NOTE — Patient Instructions (Addendum)
[email or web address]   Breastfeeding Choosing to breastfeed is one of the best decisions you can make for yourself and your baby. A change in hormones during pregnancy causes your breasts to make breast milk in your milk-producing glands. Hormones prevent breast milk from being released before your baby is born. They also prompt milk flow after birth. Once breastfeeding has begun, thoughts of your baby, as well as his or her sucking or crying, can stimulate the release of milk from your milk-producing glands. Benefits of breastfeeding Research shows that breastfeeding offers many health benefits for infants and mothers. It also offers a cost-free and convenient way to feed your baby. For your baby  Your first milk (colostrum) helps your baby's digestive system to function better.  Special cells in your milk (antibodies) help your baby to fight off infections.  Breastfed babies are less likely to develop asthma, allergies, obesity, or type 2 diabetes. They are also at lower risk for sudden infant death syndrome (SIDS).  Nutrients in breast milk are better able to meet your baby's needs compared to infant formula.  Breast milk improves your baby's brain development. For you  Breastfeeding helps to create a very special bond between you and your baby.  Breastfeeding is convenient. Breast milk costs nothing and is always available at the correct temperature.  Breastfeeding helps to burn calories. It helps you to lose the weight that you gained during pregnancy.  Breastfeeding makes your uterus return faster to its size before pregnancy. It also slows bleeding (lochia) after you give birth.  Breastfeeding helps to lower your risk of developing type 2 diabetes, osteoporosis, rheumatoid arthritis, cardiovascular disease, and breast, ovarian, uterine, and endometrial cancer later in life. Breastfeeding basics Starting  breastfeeding  Find a comfortable place to sit or lie down, with your neck and back well-supported.  Place a pillow or a rolled-up blanket under your baby to bring him or her to the level of your breast (if you are seated). Nursing pillows are specially designed to help support your arms and your baby while you breastfeed.  Make sure that your baby's tummy (abdomen) is facing your abdomen.  Gently massage your breast. With your fingertips, massage from the outer edges of your breast inward toward the nipple. This encourages milk flow. If your milk flows slowly, you may need to continue this action during the feeding.  Support your breast with 4 fingers underneath and your thumb above your nipple (make the letter "C" with your hand). Make sure your fingers are well away from your nipple and your baby's mouth.  Stroke your baby's lips gently with your finger or nipple.  When your baby's mouth is open wide enough, quickly bring your baby to your breast, placing your entire nipple and as much of the areola as possible into your baby's mouth. The areola is the colored area around your nipple. ? More areola should be visible above your baby's upper lip than below the lower lip. ? Your baby's lips should be opened and extended outward (flanged) to ensure an adequate, comfortable latch. ? Your baby's tongue should be between his or her lower gum and your breast.  Make sure that your baby's mouth is correctly positioned around your nipple (latched). Your baby's lips should create a seal on your breast and be turned out (everted).  It is common for your baby to suck about 2-3 minutes in order to start the flow of breast milk. Latching Teaching your baby how to latch onto your breast  properly is very important. An improper latch can cause nipple pain, decreased milk supply, and poor weight gain in your baby. Also, if your baby is not latched onto your nipple properly, he or she may swallow some air  during feeding. This can make your baby fussy. Burping your baby when you switch breasts during the feeding can help to get rid of the air. However, teaching your baby to latch on properly is still the best way to prevent fussiness from swallowing air while breastfeeding. Signs that your baby has successfully latched onto your nipple  Silent tugging or silent sucking, without causing you pain. Infant's lips should be extended outward (flanged).  Swallowing heard between every 3-4 sucks once your milk has started to flow (after your let-down milk reflex occurs).  Muscle movement above and in front of his or her ears while sucking.  Signs that your baby has not successfully latched onto your nipple  Sucking sounds or smacking sounds from your baby while breastfeeding.  Nipple pain.  If you think your baby has not latched on correctly, slip your finger into the corner of your baby's mouth to break the suction and place it between your baby's gums. Attempt to start breastfeeding again. Signs of successful breastfeeding Signs from your baby  Your baby will gradually decrease the number of sucks or will completely stop sucking.  Your baby will fall asleep.  Your baby's body will relax.  Your baby will retain a small amount of milk in his or her mouth.  Your baby will let go of your breast by himself or herself.  Signs from you  Breasts that have increased in firmness, weight, and size 1-3 hours after feeding.  Breasts that are softer immediately after breastfeeding.  Increased milk volume, as well as a change in milk consistency and color by the fifth day of breastfeeding.  Nipples that are not sore, cracked, or bleeding.  Signs that your baby is getting enough milk  Wetting at least 1-2 diapers during the first 24 hours after birth.  Wetting at least 5-6 diapers every 24 hours for the first week after birth. The urine should be clear or pale yellow by the age of 5  days.  Wetting 6-8 diapers every 24 hours as your baby continues to grow and develop.  At least 3 stools in a 24-hour period by the age of 5 days. The stool should be soft and yellow.  At least 3 stools in a 24-hour period by the age of 7 days. The stool should be seedy and yellow.  No loss of weight greater than 10% of birth weight during the first 3 days of life.  Average weight gain of 4-7 oz (113-198 g) per week after the age of 4 days.  Consistent daily weight gain by the age of 5 days, without weight loss after the age of 2 weeks. After a feeding, your baby may spit up a small amount of milk. This is normal. Breastfeeding frequency and duration Frequent feeding will help you make more milk and can prevent sore nipples and extremely full breasts (breast engorgement). Breastfeed when you feel the need to reduce the fullness of your breasts or when your baby shows signs of hunger. This is called "breastfeeding on demand." Signs that your baby is hungry include:  Increased alertness, activity, or restlessness.  Movement of the head from side to side.  Opening of the mouth when the corner of the mouth or cheek is stroked (rooting).  Increased sucking sounds, smacking lips, cooing, sighing, or squeaking.  Hand-to-mouth movements and sucking on fingers or hands.  Fussing or crying.  Avoid introducing a pacifier to your baby in the first 4-6 weeks after your baby is born. After this time, you may choose to use a pacifier. Research has shown that pacifier use during the first year of a baby's life decreases the risk of sudden infant death syndrome (SIDS). Allow your baby to feed on each breast as long as he or she wants. When your baby unlatches or falls asleep while feeding from the first breast, offer the second breast. Because newborns are often sleepy in the first few weeks of life, you may need to awaken your baby to get him or her to feed. Breastfeeding times will vary from baby to  baby. However, the following rules can serve as a guide to help you make sure that your baby is properly fed:  Newborns (babies 70 weeks of age or younger) may breastfeed every 1-3 hours.  Newborns should not go without breastfeeding for longer than 3 hours during the day or 5 hours during the night.  You should breastfeed your baby a minimum of 8 times in a 24-hour period.  Breast milk pumping Pumping and storing breast milk allows you to make sure that your baby is exclusively fed your breast milk, even at times when you are unable to breastfeed. This is especially important if you go back to work while you are still breastfeeding, or if you are not able to be present during feedings. Your lactation consultant can help you find a method of pumping that works best for you and give you guidelines about how long it is safe to store breast milk. Caring for your breasts while you breastfeed Nipples can become dry, cracked, and sore while breastfeeding. The following recommendations can help keep your breasts moisturized and healthy:  Avoid using soap on your nipples.  Wear a supportive bra designed especially for nursing. Avoid wearing underwire-style bras or extremely tight bras (sports bras).  Air-dry your nipples for 3-4 minutes after each feeding.  Use only cotton bra pads to absorb leaked breast milk. Leaking of breast milk between feedings is normal.  Use lanolin on your nipples after breastfeeding. Lanolin helps to maintain your skin's normal moisture barrier. Pure lanolin is not harmful (not toxic) to your baby. You may also hand express a few drops of breast milk and gently massage that milk into your nipples and allow the milk to air-dry.  In the first few weeks after giving birth, some women experience breast engorgement. Engorgement can make your breasts feel heavy, warm, and tender to the touch. Engorgement peaks within 3-5 days after you give birth. The following recommendations can  help to ease engorgement:  Completely empty your breasts while breastfeeding or pumping. You may want to start by applying warm, moist heat (in the shower or with warm, water-soaked hand towels) just before feeding or pumping. This increases circulation and helps the milk flow. If your baby does not completely empty your breasts while breastfeeding, pump any extra milk after he or she is finished.  Apply ice packs to your breasts immediately after breastfeeding or pumping, unless this is too uncomfortable for you. To do this: ? Put ice in a plastic bag. ? Place a towel between your skin and the bag. ? Leave the ice on for 20 minutes, 2-3 times a day.  Make sure that your baby is latched on and  positioned properly while breastfeeding.  If engorgement persists after 48 hours of following these recommendations, contact your health care provider or a Science writer. Overall health care recommendations while breastfeeding  Eat 3 healthy meals and 3 snacks every day. Well-nourished mothers who are breastfeeding need an additional 450-500 calories a day. You can meet this requirement by increasing the amount of a balanced diet that you eat.  Drink enough water to keep your urine pale yellow or clear.  Rest often, relax, and continue to take your prenatal vitamins to prevent fatigue, stress, and low vitamin and mineral levels in your body (nutrient deficiencies).  Do not use any products that contain nicotine or tobacco, such as cigarettes and e-cigarettes. Your baby may be harmed by chemicals from cigarettes that pass into breast milk and exposure to secondhand smoke. If you need help quitting, ask your health care provider.  Avoid alcohol.  Do not use illegal drugs or marijuana.  Talk with your health care provider before taking any medicines. These include over-the-counter and prescription medicines as well as vitamins and herbal supplements. Some medicines that may be harmful to your baby  can pass through breast milk.  It is possible to become pregnant while breastfeeding. If birth control is desired, ask your health care provider about options that will be safe while breastfeeding your baby. Where to find more information: [employer]: www.llli.org Contact a health care provider if:  You feel like you want to stop breastfeeding or have become frustrated with breastfeeding.  Your nipples are cracked or bleeding.  Your breasts are red, tender, or warm.  You have: ? Painful breasts or nipples. ? A swollen area on either breast. ? A fever or chills. ? Nausea or vomiting. ? Drainage other than breast milk from your nipples.  Your breasts do not become full before feedings by the fifth day after you give birth.  You feel sad and depressed.  Your baby is: ? Too sleepy to eat well. ? Having trouble sleeping. ? More than 18 week old and wetting fewer than 6 diapers in a 24-hour period. ? Not gaining weight by 63 days of age.  Your baby has fewer than 3 stools in a 24-hour period.  Your baby's skin or the white parts of his or her eyes become yellow. Get help right away if:  Your baby is overly tired (lethargic) and does not want to wake up and feed.  Your baby develops an unexplained fever. Summary  Breastfeeding offers many health benefits for infant and mothers.  Try to breastfeed your infant when he or she shows early signs of hunger.  Gently tickle or stroke your baby's lips with your finger or nipple to allow the baby to open his or her mouth. Bring the baby to your breast. Make sure that much of the areola is in your baby's mouth. Offer one side and burp the baby before you offer the other side.  Talk with your health care provider or lactation consultant if you have questions or you face problems as you breastfeed. This information is not intended to replace advice given to you by your health care provider. Make sure you discuss any  questions you have with your health care provider. Document Released: 04/07/2005 Document Revised: 05/09/2016 Document Reviewed: 05/09/2016 Elsevier Interactive Patient Education  [employer].

## 2017-06-09 NOTE — Progress Notes (Signed)
Genetic Counseling  High-Risk Gestation Note  Appointment Date:  06/09/2017 Referred By: Jorje Guild, NP Date of Birth:  1980-08-11   Pregnancy History: [id number] Estimated Date of Delivery: 10/26/17 Estimated Gestational Age: [redacted]w[redacted]d Attending: Griffin Dakin, MD   [name] was seen for genetic counseling because of a maternal age of 37 y.o..     In summary:  Discussed AMA and associated risk for fetal aneuploidy  Discussed options for screening  Quad screen  NIPS- elected to pursue Panorama today  Ultrasound- performed today; see separate report  Discussed diagnostic testing options  Amniocentesis- declined  Reviewed family history concerns  Discussed carrier screening options  CF- declined  SMA- declined  Hemoglobinopathies- previously performed through OB and within normal limits  She was counseled regarding maternal age and the association with risk for chromosome conditions due to nondisjunction with aging of the ova.   We reviewed chromosomes, nondisjunction, and the associated 1 in 141 risk for fetal aneuploidy related to a maternal age of 37 y.o. at [redacted]w[redacted]d gestation.  She was counseled that the risk for aneuploidy decreases as gestational age increases, accounting for those pregnancies which spontaneously abort.  We specifically discussed Down syndrome (trisomy 51), trisomies 55 and 104, and sex chromosome aneuploidies (47,XXX and 47,XXY) including the common features and prognoses of each.   We reviewed available screening options including Quad screen, noninvasive prenatal screening (NIPS)/cell free DNA (cfDNA) screening, and detailed ultrasound.  She was counseled that screening tests are used to modify a patient's a priori risk for aneuploidy, typically based on age. This estimate provides a pregnancy specific risk assessment. We reviewed the benefits and limitations of each option. Specifically, we discussed the conditions for which each test screens, the  detection rates, and false positive rates of each. She was also counseled regarding diagnostic testing via amniocentesis. We reviewed the approximate 1 in 536-144 risk for complications from amniocentesis, including spontaneous pregnancy loss. We discussed the possible results that the tests might provide including: positive, negative, unanticipated, and no result. Finally, they were counseled regarding the cost of each option and potential out of pocket expenses.  After consideration of all the options, she elected to proceed with NIPS (Panorama through [facility] laboratory).  Those results will be available in 8-10 days.  She declined amniocentesis.   A detailed ultrasound was performed today. The ultrasound report will be sent under separate cover. There were no visualized fetal anomalies or markers suggestive of aneuploidy. She understands that screening tests cannot rule out all birth defects or genetic syndromes. The patient was advised of this limitation and states she still does not want additional testing at this time.   [name] was provided with written information regarding cystic fibrosis (CF), spinal muscular atrophy (SMA) and hemoglobinopathies including the carrier frequency, availability of carrier screening and prenatal diagnosis if indicated.  In addition, we discussed that CF and hemoglobinopathies are routinely screened for as part of the [place] newborn screening panel. Hemoglobin electrophoresis was previously performed and was within normal limits. After further discussion, she declined screening for CF and SMA.   Both family histories were reviewed and found to be noncontributory for birth defects, intellectual disability, and known genetic conditions. African American ancestry was reported for the couple. Consanguinity was denied. Without further information regarding the provided family history, an accurate genetic risk cannot be calculated. Further genetic counseling is warranted  if more information is obtained.  [name] denied exposure to environmental toxins or chemical agents. She  denied the use of alcohol, tobacco or street drugs. She denied significant viral illnesses during the course of her pregnancy.   I counseled [name] regarding the above risks and available options. Most of the counseling was provided by Carmin Richmond, [employer] genetic counseling student, under my direct supervision. The approximate face-to-face time with the genetic counselor was 45 minutes.  Chipper Oman, MS,  Certified Genetic Counselor 06/09/2017

## 2017-06-15 ENCOUNTER — Other Ambulatory Visit: Payer: Self-pay

## 2017-06-18 ENCOUNTER — Telehealth (HOSPITAL_COMMUNITY): Payer: Self-pay | Admitting: MS"

## 2017-06-18 NOTE — Telephone Encounter (Signed)
Called [name] to discuss her prenatal cell free DNA test results.  [name] had Panorama testing through [facility].  Testing was offered because of advanced maternal age.   The patient was identified by name and DOB.  We reviewed that these are within normal limits, showing a less than 1 in 10,000 risk for trisomies 21, 18 and 13, and monosomy X (Turner syndrome).  In addition, the risk for triploidy and sex chromosome trisomies (47,XXX and 47,XXY) was also low risk.  Kimberly Macias elected to have cffDNA analysis for 22q11 deletion syndrome, which was also low risk (1 in 9000).  We reviewed that this testing identifies > 99% of pregnancies with trisomy 75, trisomy 32, sex chromosome trisomies (47,XXX and 47,XXY), and triploidy. The detection rate for trisomy 18 is 96%.  The detection rate for monosomy X is ~92%.  The false positive rate is <0.1% for all conditions. Testing was also consistent with female fetal sex. Patient requested fetal sex be disclosed to her at this time.   She understands that this testing does not identify all genetic conditions.  All questions were answered to her satisfaction, she was encouraged to call with additional questions or concerns.  Chipper Oman, MS Certified Genetic Counselor 06/18/2017 11:31 AM

## 2017-06-23 ENCOUNTER — Encounter: Payer: Self-pay | Admitting: Obstetrics and Gynecology

## 2017-07-06 ENCOUNTER — Encounter: Payer: Self-pay | Admitting: Family Medicine

## 2017-07-07 ENCOUNTER — Ambulatory Visit (INDEPENDENT_AMBULATORY_CARE_PROVIDER_SITE_OTHER): Payer: Medicaid Other | Admitting: Family Medicine

## 2017-07-07 ENCOUNTER — Encounter: Payer: Self-pay | Admitting: Family Medicine

## 2017-07-07 ENCOUNTER — Ambulatory Visit (INDEPENDENT_AMBULATORY_CARE_PROVIDER_SITE_OTHER): Payer: Medicaid Other | Admitting: Clinical

## 2017-07-07 VITALS — BP 106/64 | HR 80 | Wt 147.0 lb

## 2017-07-07 DIAGNOSIS — R55 Syncope and collapse: Secondary | ICD-10-CM | POA: Insufficient documentation

## 2017-07-07 DIAGNOSIS — O99013 Anemia complicating pregnancy, third trimester: Secondary | ICD-10-CM | POA: Insufficient documentation

## 2017-07-07 DIAGNOSIS — F4321 Adjustment disorder with depressed mood: Secondary | ICD-10-CM | POA: Diagnosis not present

## 2017-07-07 DIAGNOSIS — O99340 Other mental disorders complicating pregnancy, unspecified trimester: Secondary | ICD-10-CM

## 2017-07-07 DIAGNOSIS — F32A Depression, unspecified: Secondary | ICD-10-CM | POA: Insufficient documentation

## 2017-07-07 DIAGNOSIS — F329 Major depressive disorder, single episode, unspecified: Secondary | ICD-10-CM

## 2017-07-07 DIAGNOSIS — O99342 Other mental disorders complicating pregnancy, second trimester: Secondary | ICD-10-CM

## 2017-07-07 DIAGNOSIS — Z3482 Encounter for supervision of other normal pregnancy, second trimester: Secondary | ICD-10-CM

## 2017-07-07 DIAGNOSIS — D508 Other iron deficiency anemias: Secondary | ICD-10-CM

## 2017-07-07 DIAGNOSIS — Z3480 Encounter for supervision of other normal pregnancy, unspecified trimester: Secondary | ICD-10-CM

## 2017-07-07 MED ORDER — FERROUS SULFATE 325 (65 FE) MG PO TABS: 325.0000 mg | ORAL_TABLET | Freq: Two times a day (BID) | ORAL | 3 refills | Status: DC

## 2017-07-07 NOTE — Progress Notes (Signed)
   PRENATAL VISIT NOTE  Subjective:  Kimberly Macias is a 37 y.o. Q4O9629 at [redacted]w[redacted]d being seen today for ongoing prenatal care.  She is currently monitored for the following issues for this low-risk pregnancy and has Supervision of other normal pregnancy, antepartum; History of gestational diabetes; AMA (advanced maternal age) multigravida 82+, first trimester; [redacted] weeks gestation of pregnancy; Syncope; Anemia; and Depression affecting pregnancy on their problem list.  Patient reports she passed out a few days ago at home. She says she felt dizzy, then doesn't remember anything else. Says she does not drink very much water but eats ice constantly. She had anemia with previous pregnancy and last hgb was 10.4. Patient says she feels tired all the time and a little sad as well. Denies SI and does not want to start anti-depressant at this time.    Contractions: Not present. Vag. Bleeding: None.  Movement: Present. Denies leaking of fluid.   The following portions of the patient's history were reviewed and updated as appropriate: allergies, current medications, past family history, past medical history, past social history, past surgical history and problem list. Problem list updated.  Objective:   Vitals:   07/07/17 0910  BP: 106/64  Pulse: 80  Weight: 66.7 kg (147 lb)    Fetal Status: Fetal Heart Rate (bpm): 165 Fundal Height: 24 cm Movement: Present     General:  Alert, oriented and cooperative. Patient is in no acute distress.  Skin: Skin is warm and dry. No rash noted.   Cardiovascular: Normal heart rate noted  Respiratory: Normal respiratory effort, no problems with respiration noted  Abdomen: Soft, gravid, appropriate for gestational age.  Pain/Pressure: Absent     Pelvic: Cervical exam deferred        Extremities: Normal range of motion.  Edema: None  Mental Status:  Normal mood and affect. Normal behavior. Normal judgment and thought content.   Assessment and Plan:  Pregnancy:  G4P1021 at [redacted]w[redacted]d  1. Supervision of other normal pregnancy, antepartum - CBC  2. Other iron deficiency anemia Cbc today. Start iron since last hgb was 10.4  3. Syncope, unspecified syncope type May be due to anemia or dehydration  4. Depression affecting pregnancy To see BH. No meds at this time  Preterm labor symptoms and general obstetric precautions including but not limited to vaginal bleeding, contractions, leaking of fluid and fetal movement were reviewed in detail with the patient. Please refer to After Visit Summary for other counseling recommendations.  Return in about 4 weeks (around 08/04/2017) for LOB and 2 hr GTT.   Dannielle Huh, DO

## 2017-07-07 NOTE — Patient Instructions (Signed)

## 2017-07-07 NOTE — BH Specialist Note (Signed)
Integrated Behavioral Health Initial Visit  MRN: [id number] Name: Kimberly Macias  Number of [facility] Clinician visits:: 1/6 Session Start time: 9:35  Session End time: 9:54 Total time: 20 minutes  Type of Service: [place] Interpretor:No. Interpretor Name and Language: n/a   Warm Hand Off Completed.       SUBJECTIVE: Kimberly Macias is a 37 y.o. female accompanied by n/a Patient was referred by Dr Manus Rudd for depression. Patient reports the following symptoms/concerns: Pt states her primary concern today as feeling "a little sad"; difficulty falling asleep with FOB's CPAP machine/snoring sounds. 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 Reduce symptoms of: depression and stress 2. Increase knowledge and/or ability of: stress reduction  3. Demonstrate ability to: Increase healthy adjustment to current life circumstances  INTERVENTIONS: Interventions utilized: Sleep Hygiene and Psychoeducation and/or Health Education  Standardized Assessments completed: GAD-7 and PHQ 9  ASSESSMENT: Patient currently experiencing Adjustment disorder with depressed mood.   Patient may benefit from psychoeducation and brief therapeutic interventions regarding coping with symptoms of depression and life stress .  PLAN: 1. Follow up with behavioral health clinician on : One month 2. Behavioral recommendations:  -Use sleep app at night for improved sleep -Take iron pills, as prescribed by medical provider -Read educational  materials regarding coping with symptoms of depression  3. Referral(s): [place] (In Clinic) and [employer] Resources:  [employer] 4. "From scale of 1-10, how likely are you to follow plan?": 9  Garlan Fair, LCSW  Depression screen [facility] 2/9 06/15/2017 06/11/2017 04/22/2017  Decreased Interest 1 1 1   Down, Depressed, Hopeless 0 0 2  PHQ - 2 Score 1 1 3   Altered sleeping 1 1 1   Tired, decreased energy 1 1 1   Change in appetite 0 0 0  Feeling bad or failure about yourself  0 0 0  Trouble concentrating 0 0 0  Moving slowly or fidgety/restless 0 0 0  Suicidal thoughts 0 0 -  PHQ-9 Score 3 3 5    GAD 7 : Generalized Anxiety Score 06/15/2017 06/11/2017 04/22/2017  Nervous, Anxious, on Edge 0 0 1  Control/stop worrying 0 0 1  Worry too much - different things 1 1 1   Trouble relaxing 0 0 0  Restless 0 0 0  Easily annoyed or irritable 1 1 1   Afraid - awful might happen 0 0 0  Total GAD 7 Score 2 2 4

## 2017-07-08 LAB — CBC
HEMATOCRIT: 26.1 % — AB (ref 34.0–46.6)
Hemoglobin: 8.6 g/dL — ABNORMAL LOW (ref 11.1–15.9)
MCH: 26.7 pg (ref 26.6–33.0)
MCHC: 33 g/dL (ref 31.5–35.7)
MCV: 81 fL (ref 79–97)
PLATELETS: 296 10*3/uL (ref 150–379)
RBC: 3.22 x10E6/uL — ABNORMAL LOW (ref 3.77–5.28)
RDW: 15.8 % — AB (ref 12.3–15.4)
WBC: 6.6 10*3/uL (ref 3.4–10.8)

## 2017-07-09 ENCOUNTER — Encounter: Payer: Self-pay | Admitting: Family Medicine

## 2017-07-13 ENCOUNTER — Encounter: Payer: Self-pay | Admitting: Family Medicine

## 2017-07-22 ENCOUNTER — Encounter: Payer: Self-pay | Admitting: Family Medicine

## 2017-07-23 ENCOUNTER — Inpatient Hospital Stay (HOSPITAL_COMMUNITY)
Admission: AD | Admit: 2017-07-23 | Discharge: 2017-07-23 | Disposition: A | Discharge status: Home / Self Care | Payer: Medicaid Other | Source: Ambulatory Visit | Attending: Obstetrics and Gynecology | Admitting: Obstetrics and Gynecology

## 2017-07-23 ENCOUNTER — Other Ambulatory Visit: Payer: Self-pay

## 2017-07-23 DIAGNOSIS — O9989 Other specified diseases and conditions complicating pregnancy, childbirth and the puerperium: Secondary | ICD-10-CM | POA: Diagnosis not present

## 2017-07-23 DIAGNOSIS — O26892 Other specified pregnancy related conditions, second trimester: Secondary | ICD-10-CM | POA: Insufficient documentation

## 2017-07-23 DIAGNOSIS — O99612 Diseases of the digestive system complicating pregnancy, second trimester: Secondary | ICD-10-CM | POA: Diagnosis not present

## 2017-07-23 DIAGNOSIS — D649 Anemia, unspecified: Secondary | ICD-10-CM | POA: Diagnosis not present

## 2017-07-23 DIAGNOSIS — Z3A26 26 weeks gestation of pregnancy: Secondary | ICD-10-CM | POA: Insufficient documentation

## 2017-07-23 DIAGNOSIS — O99012 Anemia complicating pregnancy, second trimester: Secondary | ICD-10-CM | POA: Diagnosis not present

## 2017-07-23 DIAGNOSIS — A084 Viral intestinal infection, unspecified: Secondary | ICD-10-CM | POA: Diagnosis not present

## 2017-07-23 LAB — COMPREHENSIVE METABOLIC PANEL
ALK PHOS: 70 U/L (ref 38–126)
ALT: 13 U/L — AB (ref 14–54)
AST: 19 U/L (ref 15–41)
Albumin: 3 g/dL — ABNORMAL LOW (ref 3.5–5.0)
Anion gap: 9 (ref 5–15)
BUN: 6 mg/dL (ref 6–20)
CO2: 20 mmol/L — AB (ref 22–32)
CREATININE: 0.36 mg/dL — AB (ref 0.44–1.00)
Calcium: 8.7 mg/dL — ABNORMAL LOW (ref 8.9–10.3)
Chloride: 104 mmol/L (ref 101–111)
GFR calc Af Amer: >60 mL/min (ref >60)
GFR calc non Af Amer: >60 mL/min (ref >60)
GLUCOSE: 101 mg/dL — AB (ref 65–99)
Potassium: 3.5 mmol/L (ref 3.5–5.1)
SODIUM: 133 mmol/L — AB (ref 135–145)
Total Bilirubin: 1 mg/dL (ref 0.3–1.2)
Total Protein: 7 g/dL (ref 6.5–8.1)

## 2017-07-23 LAB — URINALYSIS, ROUTINE W REFLEX MICROSCOPIC
BILIRUBIN URINE: NEGATIVE
Glucose, UA: NEGATIVE mg/dL
Hgb urine dipstick: NEGATIVE
Ketones, ur: NEGATIVE mg/dL
Nitrite: NEGATIVE
PH: 5 (ref 5.0–8.0)
Protein, ur: NEGATIVE mg/dL
Specific Gravity, Urine: 1.023 (ref 1.005–1.030)

## 2017-07-23 LAB — CBC
HCT: 28.1 % — ABNORMAL LOW (ref 36.0–46.0)
HEMOGLOBIN: 8.9 g/dL — AB (ref 12.0–15.0)
MCH: 26.9 pg (ref 26.0–34.0)
MCHC: 31.7 g/dL (ref 30.0–36.0)
MCV: 84.9 fL (ref 78.0–100.0)
PLATELETS: 284 10*3/uL (ref 150–400)
RBC: 3.31 MIL/uL — AB (ref 3.87–5.11)
RDW: 18.7 % — ABNORMAL HIGH (ref 11.5–15.5)
WBC: 7.3 10*3/uL (ref 4.0–10.5)

## 2017-07-23 MED ORDER — ACETAMINOPHEN 500 MG PO TABS
1000.0000 mg | ORAL_TABLET | Freq: Once | ORAL | Status: AC
Start: 2017-07-23 — End: 2017-07-23
  Administered 2017-07-23: 1000 mg via ORAL
  Filled 2017-07-23: qty 2

## 2017-07-23 NOTE — Progress Notes (Addendum)
G4P1 @ 26.[redacted] wksga. Here for N/V and feeling dizzyy. Last diarrea was Sunday. Denies LOF or bleedng. +FM.   Ate apple sauce this morning and able to keep it down. Requesting icechips and drink. Cup of ice chips and gingerale given.   1232: Medicated per order. Pt tolerating drink and icechips  1304: provider at bs assessing.

## 2017-07-23 NOTE — MAU Provider Note (Signed)
Chief Complaint: Emesis; Nausea; and Dizziness   First Provider Initiated Contact with Patient 07/23/17 1129      SUBJECTIVE HPI: Kimberly Macias is a 37 y.o. G4P1021 at [redacted]w[redacted]d by LMP who presents to maternity admissions reporting nausea, headache, generalized weakness/fatigue and dizziness. Patient states symptoms began on 07/19/2017 with nausea, vomiting and diarrhea. Last had diarrhea on 07/20/2017. Last vomited yesterday, one time, after patient tried eating. No further vomiting today, patient was able to eat apple sauce today at 0900. Has been able to tolerate fluids. Headache began 07/20/2017, location R side frontally, is constant and currently 5/10, improves with sleep. Unable to describe character of pain. Generalized weakness and fatigue has been present since just before the nausea/vomiting. Also c/o intermittent dizziness for the last several weeks, no association with position change or standing. No known ill contacts.   Last Hgb measured at 8.6 on 07/07/2017. Patient has been taking PO iron, states it has not improved her dizziness.   She denies RUQ or epigastric pain, visual disturbances, vaginal bleeding, vaginal itching/burning, urinary symptoms, or fever/chills.     HPI  Past Medical History:  Diagnosis Date  . Gestational diabetes 2010   giet controlled   Past Surgical History:  Procedure Laterality Date  . DIAGNOSTIC LAPAROSCOPY WITH REMOVAL OF ECTOPIC PREGNANCY N/A 07/05/2016   Procedure: DIAGNOSTIC LAPAROSCOPY WITH REMOVAL OF ECTOPIC PREGNANCY;  Surgeon: [name], MD;  Location: [facility];  Service: Gynecology;  Laterality: N/A;  . LAPAROSCOPIC UNILATERAL SALPINGECTOMY Left 07/05/2016   Procedure: LAPAROSCOPIC UNILATERAL SALPINGECTOMY;  Surgeon: [name], MD;  Location: [facility];  Service: Gynecology;  Laterality: Left;   Social History   Socioeconomic History  . Marital status: Single    Spouse name: Not on file  . Number of children: Not on file  . Years of  education: Not on file  . Highest education level: Not on file  Occupational History  . Not on file  Social Needs  . Financial resource strain: Not on file  . Food insecurity:    Worry: Not on file    Inability: Not on file  . Transportation needs:    Medical: Not on file    Non-medical: Not on file  Tobacco Use  . Smoking status: Never Smoker  . Smokeless tobacco: Never Used  Substance and Sexual Activity  . Alcohol use: No    Frequency: Never  . Drug use: No  . Sexual activity: Yes    Birth control/protection: None  Lifestyle  . Physical activity:    Days per week: Not on file    Minutes per session: Not on file  . Stress: Not on file  Relationships  . Social connections:    Talks on phone: Not on file    Gets together: Not on file    Attends religious service: Not on file    Active member of club or organization: Not on file    Attends meetings of clubs or organizations: Not on file    Relationship status: Not on file  . Intimate partner violence:    Fear of current or ex partner: Not on file    Emotionally abused: Not on file    Physically abused: Not on file    Forced sexual activity: Not on file  Other Topics Concern  . Not on file  Social History Narrative  . Not on file   No current facility-administered medications on file prior to encounter.    Current Outpatient Medications on File  Prior to Encounter  Medication Sig Dispense Refill  . ferrous sulfate 325 (65 FE) MG tablet Take 1 tablet (325 mg total) by mouth 2 (two) times daily with a meal. 60 tablet 3  . Prenatal MV & Min w/FA-DHA (PRENATAL ADULT GUMMY/DHA/FA) 0.4-25 MG CHEW Chew 1 Piece by mouth daily. 30 tablet 8   No Known Allergies  ROS:  Review of Systems  Constitutional: Positive for fatigue. Negative for chills and fever.  Gastrointestinal: Positive for diarrhea (last 07/20/2017), nausea and vomiting (last yesterday x1). Negative for abdominal pain.  Genitourinary: Negative for dysuria,  hematuria, vaginal bleeding and vaginal discharge.  Neurological: Positive for dizziness (intermittent x weeks), weakness (generalized) and headaches.  All other systems reviewed and are negative.    I have reviewed patient's Past Medical Hx, Surgical Hx, Family Hx, Social Hx, medications and allergies.   Physical Exam   Patient Vitals for the past 24 hrs:  BP Temp Pulse Resp Height Weight  07/23/17 1059 (!) 115/57 98.5 F (36.9 C) 85 18 5' 3.5" (1.613 m) 66.7 kg (147 lb)   Constitutional: Well-developed, well-nourished female in no acute distress.  Cardiovascular: normal rate and rhythm  Respiratory: normal effort GI: Abd soft, non-tender to palpation all quadrants.  MS: Extremities nontender, no edema, normal ROM Neurologic: Alert and oriented x 4.   FHT 150 by doppler  LAB RESULTS Results for orders placed or performed during the hospital encounter of 07/23/17 (from the past 24 hour(s))  Urinalysis, Routine w reflex microscopic     Status: Abnormal   Collection Time: 07/23/17 10:55 AM  Result Value Ref Range   Color, Urine YELLOW YELLOW   APPearance CLOUDY (A) CLEAR   Specific Gravity, Urine 1.023 1.005 - 1.030   pH 5.0 5.0 - 8.0   Glucose, UA NEGATIVE NEGATIVE mg/dL   Hgb urine dipstick NEGATIVE NEGATIVE   Bilirubin Urine NEGATIVE NEGATIVE   Ketones, ur NEGATIVE NEGATIVE mg/dL   Protein, ur NEGATIVE NEGATIVE mg/dL   Nitrite NEGATIVE NEGATIVE   Leukocytes, UA MODERATE (A) NEGATIVE   RBC / HPF 0-5 0 - 5 RBC/hpf   WBC, UA 6-30 0 - 5 WBC/hpf   Bacteria, UA RARE (A) NONE SEEN   Squamous Epithelial / LPF 6-30 (A) NONE SEEN   Mucus PRESENT   CBC     Status: Abnormal   Collection Time: 07/23/17 12:18 PM  Result Value Ref Range   WBC 7.3 4.0 - 10.5 K/uL   RBC 3.31 (L) 3.87 - 5.11 MIL/uL   Hemoglobin 8.9 (L) 12.0 - 15.0 g/dL   HCT 28.1 (L) 36.0 - 46.0 %   MCV 84.9 78.0 - 100.0 fL   MCH 26.9 26.0 - 34.0 pg   MCHC 31.7 30.0 - 36.0 g/dL   RDW 18.7 (H) 11.5 - 15.5 %    Platelets 284 150 - 400 K/uL  Comprehensive metabolic panel     Status: Abnormal   Collection Time: 07/23/17 12:18 PM  Result Value Ref Range   Sodium 133 (L) 135 - 145 mmol/L   Potassium 3.5 3.5 - 5.1 mmol/L   Chloride 104 101 - 111 mmol/L   CO2 20 (L) 22 - 32 mmol/L   Glucose, Bld 101 (H) 65 - 99 mg/dL   BUN 6 6 - 20 mg/dL   Creatinine, Ser 0.36 (L) 0.44 - 1.00 mg/dL   Calcium 8.7 (L) 8.9 - 10.3 mg/dL   Total Protein 7.0 6.5 - 8.1 g/dL   Albumin 3.0 (L) 3.5 - 5.0  g/dL   AST 19 15 - 41 U/L   ALT 13 (L) 14 - 54 U/L   Alkaline Phosphatase 70 38 - 126 U/L   Total Bilirubin 1.0 0.3 - 1.2 mg/dL   GFR calc non Af Amer >60 >60 mL/min   GFR calc Af Amer >60 >60 mL/min   Anion gap 9 5 - 15    O/Positive/-- (01/02 1003)  IMAGING No results found.  MAU Management/MDM: Orders Placed This Encounter  Procedures  . Culture, OB Urine  . Urinalysis, Routine w reflex microscopic  . CBC  . Comprehensive metabolic panel  . Discharge patient Discharge disposition: 01-Home or Self Care; Discharge patient date: 07/23/2017    Meds ordered this encounter  Medications  . acetaminophen (TYLENOL) tablet 1,000 mg   Check CBC to assess for anemia, CMP for any electrolyte disturbances, UA for any signs of dehydration. 1000mg  Tylenol for headache, monitor in MAU while patient drinks ginger ale for any further vomiting.   Patient states Tylenol made her feel slightly nauseated. No vomiting while in MAU. Hgb today 8.9, up from 8.6 on 07/07/2017, advised patient to keep taking PO iron as rx'ed. Labs otherwise unremarkable. Advised patient this is likely a viral gastroenteritis, to drink plenty of fluids and gradually advance diet as tolerated. Return precautions given.   ASSESSMENT 1. Viral gastroenteritis   2. [redacted] weeks gestation of pregnancy   3. Anemia during pregnancy in second trimester     PLAN Discharge home  [facility] for [facility] Follow up.    Specialty:  Obstetrics and Gynecology Why:  As scheduled for prenatal care Contact information: [place] [phone]          Allergies as of 07/23/2017   No Known Allergies     Medication List    TAKE these medications   ferrous sulfate 325 (65 FE) MG tablet Take 1 tablet (325 mg total) by mouth 2 (two) times daily with a meal.   Prenatal Adult Gummy/DHA/FA 0.4-25 MG Chew Chew 1 Piece by mouth daily.        Marvis Repress, Medical Student 07/23/2017  1:09 PM

## 2017-07-23 NOTE — MAU Provider Note (Signed)
History     CSN: [id number]  Arrival date and time: 07/23/17 1036   First Provider Initiated Contact with Patient 07/23/17 1129      Chief Complaint  Patient presents with  . Emesis  . Nausea  . Dizziness   HPI Kimberly Macias is a 37 y.o. [id number] at [redacted]w[redacted]d by LMP who presents to maternity admissions reporting nausea, headache, generalized weakness/fatigue and dizziness. Patient states symptoms began on 07/19/2017 with nausea, vomiting and diarrhea. Last had diarrhea on 07/20/2017. Last vomited yesterday, one time, after patient tried eating. No further vomiting today, patient was able to eat apple sauce today at 0900. Has been able to tolerate fluids. Headache began 07/20/2017, location R side frontally, is constant and currently 5/10, improves with sleep. Unable to describe character of pain. Generalized weakness and fatigue has been present since just before the nausea/vomiting. Also c/o intermittent dizziness for the last several weeks, no association with position change or standing. No known ill contacts.   Last Hgb measured at 8.6 on 07/07/2017. Patient has been taking PO iron, states it has not improved her dizziness.   She denies RUQ or epigastric pain, visual disturbances, vaginal bleeding, vaginal itching/burning, urinary symptoms, or fever/chills.  OB History    Gravida  4   Para  1   Term  1   Preterm  0   AB  2   Living  1     SAB  0   TAB  1   Ectopic  1   Multiple  0   Live Births  1           Past Medical History:  Diagnosis Date  . Gestational diabetes 2010   giet controlled    Past Surgical History:  Procedure Laterality Date  . DIAGNOSTIC LAPAROSCOPY WITH REMOVAL OF ECTOPIC PREGNANCY N/A 07/05/2016   Procedure: DIAGNOSTIC LAPAROSCOPY WITH REMOVAL OF ECTOPIC PREGNANCY;  Surgeon: [name], MD;  Location: [facility];  Service: Gynecology;  Laterality: N/A;  . LAPAROSCOPIC UNILATERAL SALPINGECTOMY Left 07/05/2016   Procedure: LAPAROSCOPIC  UNILATERAL SALPINGECTOMY;  Surgeon: [name], MD;  Location: [facility];  Service: Gynecology;  Laterality: Left;    No family history on file.  Social History   Tobacco Use  . Smoking status: Never Smoker  . Smokeless tobacco: Never Used  Substance Use Topics  . Alcohol use: No    Frequency: Never  . Drug use: No    Allergies: No Known Allergies  Medications Prior to Admission  Medication Sig Dispense Refill Last Dose  . ferrous sulfate 325 (65 FE) MG tablet Take 1 tablet (325 mg total) by mouth 2 (two) times daily with a meal. 60 tablet 3   . Prenatal MV & Min w/FA-DHA (PRENATAL ADULT GUMMY/DHA/FA) 0.4-25 MG CHEW Chew 1 Piece by mouth daily. 30 tablet 8 Taking    Review of Systems  Constitutional: Negative.  Negative for fatigue and fever.  HENT: Negative.   Respiratory: Negative.  Negative for shortness of breath.   Cardiovascular: Negative.  Negative for chest pain.  Gastrointestinal: Positive for nausea and vomiting. Negative for abdominal pain, constipation and diarrhea.  Genitourinary: Negative.  Negative for dysuria.  Neurological: Positive for headaches. Negative for dizziness.   Physical Exam   Blood pressure (!) 115/57, pulse 85, temperature 98.5 F (36.9 C), resp. rate 18, height 5' 3.5" (1.613 m), weight 147 lb (66.7 kg), last menstrual period 01/19/2017, unknown if currently breastfeeding.  Physical Exam  Nursing note and vitals  reviewed. Constitutional: She is oriented to person, place, and time. She appears well-developed and well-nourished. No distress.  HENT:  Head: Normocephalic.  Eyes: Pupils are equal, round, and reactive to light.  Cardiovascular: Normal rate, regular rhythm and normal heart sounds.  Respiratory: Effort normal and breath sounds normal. No respiratory distress.  GI: Soft. Bowel sounds are normal. She exhibits no distension. There is no tenderness.  Neurological: She is alert and oriented to person, place, and time.  Skin: Skin is  warm and dry.  Psychiatric: She has a normal mood and affect. Her behavior is normal. Judgment and thought content normal.   Fetal Tracing:  Baseline: 150 Variability: moderate Accels: 10x10 Decels: none  Toco: none  MAU Course  Procedures Results for orders placed or performed during the hospital encounter of 07/23/17 (from the past 24 hour(s))  Urinalysis, Routine w reflex microscopic     Status: Abnormal   Collection Time: 07/23/17 10:55 AM  Result Value Ref Range   Color, Urine YELLOW YELLOW   APPearance CLOUDY (A) CLEAR   Specific Gravity, Urine 1.023 1.005 - 1.030   pH 5.0 5.0 - 8.0   Glucose, UA NEGATIVE NEGATIVE mg/dL   Hgb urine dipstick NEGATIVE NEGATIVE   Bilirubin Urine NEGATIVE NEGATIVE   Ketones, ur NEGATIVE NEGATIVE mg/dL   Protein, ur NEGATIVE NEGATIVE mg/dL   Nitrite NEGATIVE NEGATIVE   Leukocytes, UA MODERATE (A) NEGATIVE   RBC / HPF 0-5 0 - 5 RBC/hpf   WBC, UA 6-30 0 - 5 WBC/hpf   Bacteria, UA RARE (A) NONE SEEN   Squamous Epithelial / LPF 6-30 (A) NONE SEEN   Mucus PRESENT   CBC     Status: Abnormal   Collection Time: 07/23/17 12:18 PM  Result Value Ref Range   WBC 7.3 4.0 - 10.5 K/uL   RBC 3.31 (L) 3.87 - 5.11 MIL/uL   Hemoglobin 8.9 (L) 12.0 - 15.0 g/dL   HCT 28.1 (L) 36.0 - 46.0 %   MCV 84.9 78.0 - 100.0 fL   MCH 26.9 26.0 - 34.0 pg   MCHC 31.7 30.0 - 36.0 g/dL   RDW 18.7 (H) 11.5 - 15.5 %   Platelets 284 150 - 400 K/uL  Comprehensive metabolic panel     Status: Abnormal   Collection Time: 07/23/17 12:18 PM  Result Value Ref Range   Sodium 133 (L) 135 - 145 mmol/L   Potassium 3.5 3.5 - 5.1 mmol/L   Chloride 104 101 - 111 mmol/L   CO2 20 (L) 22 - 32 mmol/L   Glucose, Bld 101 (H) 65 - 99 mg/dL   BUN 6 6 - 20 mg/dL   Creatinine, Ser 0.36 (L) 0.44 - 1.00 mg/dL   Calcium 8.7 (L) 8.9 - 10.3 mg/dL   Total Protein 7.0 6.5 - 8.1 g/dL   Albumin 3.0 (L) 3.5 - 5.0 g/dL   AST 19 15 - 41 U/L   ALT 13 (L) 14 - 54 U/L   Alkaline Phosphatase 70 38 -  126 U/L   Total Bilirubin 1.0 0.3 - 1.2 mg/dL   GFR calc non Af Amer >60 >60 mL/min   GFR calc Af Amer >60 >60 mL/min   Anion gap 9 5 - 15    MDM UA, Urine culture CBC, CMP Tylenol 1000mg  PO NST reassuring for gestational age  Assessment and Plan   1. Viral gastroenteritis   2. [redacted] weeks gestation of pregnancy   3. Anemia during pregnancy in second trimester    -  Discharge home in stable condition -BRAT diet and hand washing precautions discussed -Encouraged patient to continue taking iron supplement -Patient advised to follow-up with [facility] as scheduled for prenatal care -Patient may return to MAU as needed or if her condition were to change or worsen  Wende Mott CNM 07/23/2017, 12:56 PM

## 2017-07-23 NOTE — MAU Note (Signed)
Pt reports she had been having N/V on and off since Sunday. Had some diarrhea but not for a few days. Feeling weak and dizzy.

## 2017-07-23 NOTE — Discharge Instructions (Signed)

## 2017-07-25 LAB — CULTURE, OB URINE: Culture: 80000 — AB

## 2017-07-26 ENCOUNTER — Other Ambulatory Visit: Payer: Self-pay | Admitting: Obstetrics and Gynecology

## 2017-07-26 DIAGNOSIS — O2342 Unspecified infection of urinary tract in pregnancy, second trimester: Secondary | ICD-10-CM

## 2017-07-26 MED ORDER — NITROFURANTOIN MONOHYD MACRO 100 MG PO CAPS: 100.0000 mg | ORAL_CAPSULE | Freq: Two times a day (BID) | ORAL | 0 refills | Status: DC

## 2017-07-26 NOTE — Progress Notes (Signed)
LVM stating to call MAU provider to discuss care.   Rx for Macrobid 100 mg BID x 7 days sent to pharmacy on file  Laury Deep MSN, CNM 07/26/2017 10:26 AM   10:33AM RTC form patient. Notified of (+) UCx results and Rx sent to pharmacy. Pt voiced concerns of low HgB and "feeling very tired". She reports "doing everything she is supposed to to get her iron up, but it doesn't seem to be working". She also reports drinking more water everyday, but "not being able to get away from eating ice"; she craves it. Advised to drink 2 cups of water for every cup ice she consumes and to make sure to drink enough water that urine is pale yellow or clear in toilet. Advised to discuss with the provider at her next OB visit on 4/16 -- will send message to Dr. Lambert Keto; who will see her next.  Laury Deep MSN, CNM 07/26/2017 10:42 AM

## 2017-07-29 ENCOUNTER — Telehealth: Payer: Self-pay | Admitting: General Practice

## 2017-07-29 NOTE — Telephone Encounter (Signed)
Scheduled for 4/19 @ 10am. Called patient & informed her of results & recommended appt. Patient verbalized understanding and asked if she will need someone to drive her and what will she experience after. Told patient she will be fine to drive and they will cover all that information with her when she comes in. Patient verbalized understanding & had no questions.

## 2017-07-29 NOTE — Telephone Encounter (Signed)
-----   Message from Truett Mainland, DO sent at 07/28/2017  5:06 PM EDT ----- Regarding: Feraheme Infusion Can we get her set up with a feraheme infusion at [facility]? I would recommend Feraheme 510mg  once a week x 2 doses. She has symptomatic anemia.    Thanks Edison Nasuti

## 2017-07-30 ENCOUNTER — Encounter: Payer: Self-pay | Admitting: *Deleted

## 2017-08-03 NOTE — Progress Notes (Unsigned)
gluc

## 2017-08-03 NOTE — BH Specialist Note (Signed)
Integrated Behavioral Health Follow Up Visit  MRN: [id number] Name: Kimberly Macias  Number of [facility] Clinician visits: 2/6 Session Start time: 9:20  Session End time: 9:50 Total time: 30 minutes  Type of Service: [place] Interpretor:No. Interpretor Name and Language: n/a  SUBJECTIVE: Kimberly Macias is a 37 y.o. female accompanied by n/a Patient was referred by Dr Manus Rudd for depression. Patient reports the following symptoms/concerns: Pt says she is sleeping better since last visit, appetite has improved in the past week, will be moving into a new home on Thursday,  getting an iron infusion on Friday; looking forward to baby shower. Pt feels her symptoms are improving.  Duration of problem: Current pregnancy; Severity of problem: mild  OBJECTIVE: Mood: Appropriate and Affect: Appropriate Risk of harm to self or others: No plan to harm self or others  LIFE CONTEXT: Family and Social: Pt lives with FOB and her 10yo son; family supportive School/Work: Working Biochemist, clinical, Marine scientist: - Life Changes: Current pregnancy; plans to move on Friday  GOALS ADDRESSED: Patient will: 1.  Reduce symptoms of: depression  2.  Increase knowledge and/or ability of: stress reduction  3.  Demonstrate ability to: Increase healthy adjustment to current life circumstances  INTERVENTIONS: Interventions utilized:  Supportive Counseling and Functional Assessment of ADLs Standardized Assessments completed: Not Needed  ASSESSMENT: Patient currently experiencing Adjustment disorder with depressed mood   Patient may benefit from continued brief therapeutic interventions regarding coping with symptoms of depression.  PLAN: 1. Follow up with behavioral health clinician on : As requested by patient 2. Behavioral recommendations:  -Continue using self-coping strategies, along with keeping a positive outlook -Continue with plans to move into a  new home and obtain an iron infusion this week 3. Referral(s): [facility] (In Clinic) 4. "From scale of 1-10, how likely are you to follow plan?": 10  Garlan Fair, LCSW  Depression screen [facility] 2/9 06/15/2017 06/11/2017 04/22/2017  Decreased Interest 1 1 1   Down, Depressed, Hopeless 0 0 2  PHQ - 2 Score 1 1 3   Altered sleeping 1 1 1   Tired, decreased energy 1 1 1   Change in appetite 0 0 0  Feeling bad or failure about yourself  0 0 0  Trouble concentrating 0 0 0  Moving slowly or fidgety/restless 0 0 0  Suicidal thoughts 0 0 -  PHQ-9 Score 3 3 5    GAD 7 : Generalized Anxiety Score 06/15/2017 06/11/2017 04/22/2017  Nervous, Anxious, on Edge 0 0 1  Control/stop worrying 0 0 1  Worry too much - different things 1 1 1   Trouble relaxing 0 0 0  Restless 0 0 0  Easily annoyed or irritable 1 1 1   Afraid - awful might happen 0 0 0  Total GAD 7 Score 2 2 4

## 2017-08-04 ENCOUNTER — Ambulatory Visit (INDEPENDENT_AMBULATORY_CARE_PROVIDER_SITE_OTHER): Payer: Medicaid Other | Admitting: Clinical

## 2017-08-04 ENCOUNTER — Other Ambulatory Visit: Payer: Medicaid Other

## 2017-08-04 ENCOUNTER — Ambulatory Visit (INDEPENDENT_AMBULATORY_CARE_PROVIDER_SITE_OTHER): Payer: Medicaid Other | Admitting: Family Medicine

## 2017-08-04 VITALS — BP 104/59 | HR 79 | Wt 148.5 lb

## 2017-08-04 DIAGNOSIS — O09521 Supervision of elderly multigravida, first trimester: Secondary | ICD-10-CM | POA: Diagnosis not present

## 2017-08-04 DIAGNOSIS — Z8632 Personal history of gestational diabetes: Secondary | ICD-10-CM | POA: Diagnosis not present

## 2017-08-04 DIAGNOSIS — F329 Major depressive disorder, single episode, unspecified: Secondary | ICD-10-CM | POA: Diagnosis not present

## 2017-08-04 DIAGNOSIS — Z23 Encounter for immunization: Secondary | ICD-10-CM | POA: Diagnosis not present

## 2017-08-04 DIAGNOSIS — F4321 Adjustment disorder with depressed mood: Secondary | ICD-10-CM | POA: Diagnosis not present

## 2017-08-04 DIAGNOSIS — D508 Other iron deficiency anemias: Secondary | ICD-10-CM | POA: Diagnosis not present

## 2017-08-04 DIAGNOSIS — F32A Depression, unspecified: Secondary | ICD-10-CM

## 2017-08-04 DIAGNOSIS — O99340 Other mental disorders complicating pregnancy, unspecified trimester: Secondary | ICD-10-CM

## 2017-08-04 DIAGNOSIS — Z3480 Encounter for supervision of other normal pregnancy, unspecified trimester: Secondary | ICD-10-CM

## 2017-08-04 DIAGNOSIS — Z3482 Encounter for supervision of other normal pregnancy, second trimester: Secondary | ICD-10-CM | POA: Diagnosis not present

## 2017-08-04 NOTE — Patient Instructions (Signed)

## 2017-08-04 NOTE — Progress Notes (Signed)
Pt c/o contractions which are painful and happen "often" though she has not timed them

## 2017-08-04 NOTE — Progress Notes (Signed)
   PRENATAL VISIT NOTE  Subjective:  Kimberly Macias is a 37 y.o. G4P1021 at [redacted]w[redacted]d being seen today for ongoing prenatal care.  She is currently monitored for the following issues for this low-risk pregnancy and has Supervision of other normal pregnancy, antepartum; History of gestational diabetes; AMA (advanced maternal age) multigravida 67+, first trimester; Syncope; Anemia; and Depression affecting pregnancy on their problem list.  Patient reports occasional contractions.She has also been experiencing fatigue. She is schedule to get iron infusion later this week.    Contractions: Irritability. Vag. Bleeding: None.  Movement: Present. Denies leaking of fluid.   The following portions of the patient's history were reviewed and updated as appropriate: allergies, current medications, past family history, past medical history, past social history, past surgical history and problem list. Problem list updated.  Objective:   Vitals:   08/04/17 1046  BP: (!) 104/59  Pulse: 79  Weight: 148 lb 8 oz (67.4 kg)    Fetal Status: Fetal Heart Rate (bpm): 155 Fundal Height: 28 cm Movement: Present     General:  Alert, oriented and cooperative. Patient is in no acute distress.  Skin: Skin is warm and dry. No rash noted.   Cardiovascular: Normal heart rate noted  Respiratory: Normal respiratory effort, no problems with respiration noted  Abdomen: Soft, gravid, appropriate for gestational age.  Pain/Pressure: Present     Pelvic: Cervical exam performed Dilation: Closed Effacement (%): Thick    Extremities: Normal range of motion.  Edema: None  Mental Status: Normal mood and affect. Normal behavior. Normal judgment and thought content.   Assessment and Plan:  Pregnancy: G4P1021 at [redacted]w[redacted]d  1. Supervision of other normal pregnancy, antepartum - 28 week labs today  2. History of gestational diabetes Early Hgb A1c 5.3% - 2-hr GTT today  3. AMA (advanced maternal age) multigravida 92+, first  trimester - Low risk panoramo  4. Depression affecting pregnancy - Saw BH today. Has good support at home.  - Discussed starting daily walks to help with mood and energy level  5. Need for diphtheria-tetanus-pertussis (Tdap) vaccine - Tdap vaccine greater than or equal to 7yo IM  6. Other iron deficiency anemia Last hgb 8.9 on 07/23/17 - Scheduled for iron infusion on 08/07/17  Preterm labor symptoms and general obstetric precautions including but not limited to vaginal bleeding, contractions, leaking of fluid and fetal movement were reviewed in detail with the patient. Please refer to After Visit Summary for other counseling recommendations.  Return in about 2 weeks (around 08/18/2017) for LOB.  Future Appointments  Date Time Provider [place]  08/07/2017 10:00 AM [facility] ROOM 7 [facility] None  08/19/2017 10:15 AM Jorje Guild, NP [facility] WOC  09/03/2017  9:55 AM Jorje Guild, NP [facility]  09/17/2017  9:15 AM Jorje Guild, NP [facility]  10/01/2017  8:15 AM Jorje Guild, NP [facility]  10/07/2017  8:15 AM Jorje Guild, NP [facility]  10/14/2017  8:15 AM Jorje Guild, NP [facility]    Gailen Shelter, MD

## 2017-08-05 LAB — HIV ANTIBODY (ROUTINE TESTING W REFLEX): HIV SCREEN 4TH GENERATION: NONREACTIVE

## 2017-08-05 LAB — CBC
Hematocrit: 29.1 % — ABNORMAL LOW (ref 34.0–46.6)
Hemoglobin: 9.3 g/dL — ABNORMAL LOW (ref 11.1–15.9)
MCH: 26.1 pg — AB (ref 26.6–33.0)
MCHC: 32 g/dL (ref 31.5–35.7)
MCV: 82 fL (ref 79–97)
PLATELETS: 274 10*3/uL (ref 150–379)
RBC: 3.57 x10E6/uL — ABNORMAL LOW (ref 3.77–5.28)
RDW: 18.2 % — AB (ref 12.3–15.4)
WBC: 5.5 10*3/uL (ref 3.4–10.8)

## 2017-08-05 LAB — GLUCOSE TOLERANCE, 2 HOURS W/ 1HR
GLUCOSE, 1 HOUR: 154 mg/dL (ref 65–179)
GLUCOSE, 2 HOUR: 147 mg/dL (ref 65–152)
GLUCOSE, FASTING: 81 mg/dL (ref 65–91)

## 2017-08-05 LAB — RPR: RPR: NONREACTIVE

## 2017-08-06 ENCOUNTER — Other Ambulatory Visit (HOSPITAL_COMMUNITY): Payer: Self-pay | Admitting: *Deleted

## 2017-08-07 ENCOUNTER — Encounter (HOSPITAL_COMMUNITY)
Admission: RE | Admit: 2017-08-07 | Discharge: 2017-08-07 | Disposition: A | Discharge status: Home / Self Care | Payer: Medicaid Other | Source: Ambulatory Visit | Attending: Family Medicine | Admitting: Family Medicine

## 2017-08-07 DIAGNOSIS — O99019 Anemia complicating pregnancy, unspecified trimester: Secondary | ICD-10-CM | POA: Diagnosis present

## 2017-08-07 MED ORDER — SODIUM CHLORIDE 0.9 % IV SOLN
510.0000 mg | INTRAVENOUS | Status: DC
  Administered 2017-08-07: 510 mg via INTRAVENOUS
  Filled 2017-08-07: qty 17

## 2017-08-07 NOTE — Discharge Instructions (Signed)

## 2017-08-09 ENCOUNTER — Inpatient Hospital Stay (HOSPITAL_COMMUNITY)
Admission: AD | Admit: 2017-08-09 | Discharge: 2017-08-09 | Disposition: A | Discharge status: Home / Self Care | Payer: Medicaid Other | Source: Ambulatory Visit | Attending: Family Medicine | Admitting: Family Medicine

## 2017-08-09 ENCOUNTER — Other Ambulatory Visit: Payer: Self-pay

## 2017-08-09 ENCOUNTER — Encounter (HOSPITAL_COMMUNITY): Payer: Self-pay

## 2017-08-09 DIAGNOSIS — B373 Candidiasis of vulva and vagina: Secondary | ICD-10-CM

## 2017-08-09 DIAGNOSIS — O23593 Infection of other part of genital tract in pregnancy, third trimester: Secondary | ICD-10-CM | POA: Insufficient documentation

## 2017-08-09 DIAGNOSIS — O26893 Other specified pregnancy related conditions, third trimester: Secondary | ICD-10-CM | POA: Diagnosis present

## 2017-08-09 DIAGNOSIS — Z3480 Encounter for supervision of other normal pregnancy, unspecified trimester: Secondary | ICD-10-CM

## 2017-08-09 DIAGNOSIS — O09523 Supervision of elderly multigravida, third trimester: Secondary | ICD-10-CM | POA: Insufficient documentation

## 2017-08-09 DIAGNOSIS — O9989 Other specified diseases and conditions complicating pregnancy, childbirth and the puerperium: Secondary | ICD-10-CM

## 2017-08-09 DIAGNOSIS — N898 Other specified noninflammatory disorders of vagina: Secondary | ICD-10-CM | POA: Diagnosis not present

## 2017-08-09 DIAGNOSIS — Z8632 Personal history of gestational diabetes: Secondary | ICD-10-CM | POA: Diagnosis not present

## 2017-08-09 DIAGNOSIS — B379 Candidiasis, unspecified: Secondary | ICD-10-CM | POA: Diagnosis not present

## 2017-08-09 DIAGNOSIS — O09521 Supervision of elderly multigravida, first trimester: Secondary | ICD-10-CM

## 2017-08-09 LAB — AMNISURE RUPTURE OF MEMBRANE (ROM) NOT AT ARMC: Amnisure ROM: NEGATIVE

## 2017-08-09 LAB — WET PREP, GENITAL
CLUE CELLS WET PREP: NONE SEEN
SPERM: NONE SEEN
TRICH WET PREP: NONE SEEN

## 2017-08-09 MED ORDER — TERCONAZOLE 0.4 % VA CREA: 1.0000 | TOPICAL_CREAM | Freq: Every day | VAGINAL | 0 refills | Status: DC

## 2017-08-09 NOTE — MAU Provider Note (Signed)
Patient Kimberly Macias is a 37 y.o. [phone] At [redacted]w[redacted]d here with complaints of vaginal discharge that happened today at 2pm.  Patient denies bleeding or decreased fetal movements. Her pregnancy has been complicated by low iron.  History     CSN: [id number]  Arrival date and time: 08/09/17 1443   None     Chief Complaint  Patient presents with  . Rupture of Membranes   Vaginal Discharge  The patient's primary symptoms include vaginal discharge. This is a new problem. The current episode started today. The problem occurs intermittently. The problem has been resolved. Pertinent negatives include no abdominal pain, back pain, constipation, diarrhea, urgency or vomiting. The vaginal discharge was watery and white. There has been no bleeding.  Patient states that she was brushing her teeth at 2:30 pm and she felt water running down her leg and making a small pool on the floor. She dressed herself and came here. She has not had any more gushes of fluid. She normally wears a pad, and is wearing one this afternoon. She has not had to change her pad or her pants.   OB History    Gravida  4   Para  1   Term  1   Preterm  0   AB  2   Living  1     SAB  0   TAB  1   Ectopic  1   Multiple  0   Live Births  1           Past Medical History:  Diagnosis Date  . Gestational diabetes 2010   giet controlled    Past Surgical History:  Procedure Laterality Date  . DIAGNOSTIC LAPAROSCOPY WITH REMOVAL OF ECTOPIC PREGNANCY N/A 07/05/2016   Procedure: DIAGNOSTIC LAPAROSCOPY WITH REMOVAL OF ECTOPIC PREGNANCY;  Surgeon: [name], MD;  Location: [facility];  Service: Gynecology;  Laterality: N/A;  . LAPAROSCOPIC UNILATERAL SALPINGECTOMY Left 07/05/2016   Procedure: LAPAROSCOPIC UNILATERAL SALPINGECTOMY;  Surgeon: [name], MD;  Location: [facility];  Service: Gynecology;  Laterality: Left;    No family history on file.  Social History   Tobacco Use  . Smoking status: Never Smoker   . Smokeless tobacco: Never Used  Substance Use Topics  . Alcohol use: No    Frequency: Never  . Drug use: No    Allergies: No Known Allergies  Medications Prior to Admission  Medication Sig Dispense Refill Last Dose  . ferrous sulfate 325 (65 FE) MG tablet Take 1 tablet (325 mg total) by mouth 2 (two) times daily with a meal. 60 tablet 3 Past Month at Unknown time  . Prenatal MV & Min w/FA-DHA (PRENATAL ADULT GUMMY/DHA/FA) 0.4-25 MG CHEW Chew 1 Piece by mouth daily. 30 tablet 8 Past Week at Unknown time  . nitrofurantoin, macrocrystal-monohydrate, (MACROBID) 100 MG capsule Take 1 capsule (100 mg total) by mouth 2 (two) times daily. (Patient not taking: Reported on 08/09/2017) 14 capsule 0 Not Taking at Unknown time    Review of Systems  Constitutional: Negative.   HENT: Negative.   Respiratory: Negative.   Cardiovascular: Negative.   Gastrointestinal: Negative for abdominal pain, constipation, diarrhea and vomiting.  Genitourinary: Positive for vaginal discharge. Negative for urgency.  Musculoskeletal: Negative.  Negative for back pain.  Neurological: Negative.   Psychiatric/Behavioral: Negative.    Physical Exam   Blood pressure 100/63, pulse 82, temperature 97.9 F (36.6 C), temperature source Oral, resp. rate 16, weight 148 lb (67.1 kg), last menstrual  period 01/19/2017, SpO2 100 %, unknown if currently breastfeeding.  Physical Exam  Constitutional: She appears well-developed.  HENT:  Head: Normocephalic.  Neck: Normal range of motion.  GI: Soft.  Genitourinary: Vagina normal.  Genitourinary Comments: Normal external female genitalia; scant white/clear discharge in the vagina with small pooling. No lesions on cervix.   Musculoskeletal: Normal range of motion.  Neurological: She is alert.  Skin: Skin is warm and dry.  Psychiatric: She has a normal mood and affect.    MAU Course  Procedures  MDM Given presence of pooling and patient's complaint of water running  down her legs, will do amnisure to determine SROM.  -wet prep: positive for yeast -Amnisure: negative -gc chlamydia pending  NST: 145 bpm with mod var, present acel, neg decels, no contractions.  Assessment and Plan   1. Yeast infection   2. Supervision of other normal pregnancy, antepartum   3. AMA (advanced maternal age) multigravida 4+, first trimester    2. Patient stable for discharge with RX for terazole.   3. Patient plans to keep next ob visit.   Mervyn Skeeters [place] 08/09/2017, 5:05 PM

## 2017-08-09 NOTE — MAU Note (Signed)
Pt was in the bathroom this morning and had some leaking fluid that ran down her legs onto the floor. No bleeding. +FM. Braxton hicks and pressure. Has had iron infusions for this pregnancy.

## 2017-08-09 NOTE — Progress Notes (Signed)
G4P2 @ 28.6. Started leaking around 1400 while brushing teeth. States had another smaller leak afterwards running down her legs. Denies bleeding. +FM.  EFM applied.

## 2017-08-10 LAB — GC/CHLAMYDIA PROBE AMP (~~LOC~~) NOT AT ARMC
Chlamydia: NEGATIVE
NEISSERIA GONORRHEA: NEGATIVE

## 2017-08-13 ENCOUNTER — Other Ambulatory Visit: Payer: Self-pay | Admitting: *Deleted

## 2017-08-13 MED ORDER — MICONAZOLE NITRATE 4 % VA CREA: 1.0000 | TOPICAL_CREAM | Freq: Every day | VAGINAL | 0 refills | Status: AC

## 2017-08-13 NOTE — Progress Notes (Signed)
Called pt and informed her that the medication prescribed to treat her vaginal yeast is on back order. I have sent an alternate Rx to her pharmacy and she may pick it up today.  Pt voiced understanding.

## 2017-08-14 ENCOUNTER — Ambulatory Visit (HOSPITAL_COMMUNITY)
Admission: RE | Admit: 2017-08-14 | Discharge: 2017-08-14 | Disposition: A | Discharge status: Home / Self Care | Payer: Medicaid Other | Source: Ambulatory Visit | Attending: Family Medicine | Admitting: Family Medicine

## 2017-08-14 DIAGNOSIS — O99019 Anemia complicating pregnancy, unspecified trimester: Secondary | ICD-10-CM | POA: Diagnosis present

## 2017-08-14 MED ORDER — SODIUM CHLORIDE 0.9 % IV SOLN
510.0000 mg | INTRAVENOUS | Status: DC
  Administered 2017-08-14: 11:00:00 510 mg via INTRAVENOUS
  Filled 2017-08-14: qty 17

## 2017-08-19 ENCOUNTER — Ambulatory Visit (INDEPENDENT_AMBULATORY_CARE_PROVIDER_SITE_OTHER): Payer: Self-pay | Admitting: Student

## 2017-08-19 ENCOUNTER — Encounter: Payer: Self-pay | Admitting: General Practice

## 2017-08-19 DIAGNOSIS — Z3480 Encounter for supervision of other normal pregnancy, unspecified trimester: Secondary | ICD-10-CM

## 2017-08-19 NOTE — Patient Instructions (Addendum)
Childbirth Education Options: [facility] Classes:  Childbirth education classes can help you get ready for a positive parenting experience. You can also meet other expectant parents and get free stuff for your baby. Each class runs for five weeks on the same night and costs $45 for the mother-to-be and her support person. Medicaid covers the cost if you are eligible. Call [phone] to register. [facility] Childbirth Education:  [phone] or [phone] or [email or web address]  Baby & Me Class: Discuss newborn & infant parenting and family adjustment issues with other new mothers in a relaxed environment. Each week brings a new speaker or baby-centered activity. We encourage new mothers to join us every Thursday at 11:00am. Babies birth until crawling. No registration or fee. Daddy Boot Camp: This course offers Dads-to-be the tools and knowledge needed to feel confident on their journey to becoming new fathers. Experienced dads, who have been trained as coaches, teach dads-to-be how to hold, comfort, diaper, swaddle and play with their infant while being able to support the new mom as well. A class for men taught by men. $25/dad Big Brother/Big Sister: Let your children share in the joy of a new brother or sister in this special class designed just for them. Class includes discussion about how families care for babies: swaddling, holding, diapering, safety as well as how they can be helpful in their new role. This class is designed for children ages 2 to 6, but any age is welcome. Please register each child individually. $5/child  Mom Talk: This mom-led group offers support and connection to mothers as they journey through the adjustments and struggles of that sometimes overwhelming first year after the birth of a child. Tuesdays at 10:00am and Thursdays at 6:00pm. Babies welcome. No registration or fee. Breastfeeding Support Group: This group is a mother-to-mother  support circle where moms have the opportunity to share their breastfeeding experiences. A Lactation Consultant is present for questions and concerns. Meets each Tuesday at 11:00am. No fee or registration. Breastfeeding Your Baby: Learn what to expect in the first days of breastfeeding your newborn.  This class will help you feel more confident with the skills needed to begin your breastfeeding experience. Many new mothers are concerned about breastfeeding after leaving the hospital. This class will also address the most common fears and challenges about breastfeeding during the first few weeks, months and beyond. (call for fee) Comfort Techniques and Tour: This 2 hour interactive class will provide you the opportunity to learn & practice hands-on techniques that can help relieve some of the discomfort of labor and encourage your baby to rotate toward the best position for birth. You and your partner will be able to try a variety of labor positions with birth balls and rebozos as well as practice breathing, relaxation, and visualization techniques. A tour of the [facility] is included with this class. $20 per registrant and support person Childbirth Class- Weekend Option: This class is a Weekend version of our Birth & Baby series. It is designed for parents who have a difficult time fitting several weeks of classes into their schedule. It covers the care of your newborn and the basics of labor and childbirth. It also includes a Maternity Care Center Tour of [facility] and lunch. The class is held two consecutive days: beginning on Friday evening from 6:30 - 8:30 p.m. and the next day, Saturday from 9 a.m. - 4 p.m. (call for fee) Waterbirth Class: Interested in a waterbirth?  This   informational class will help you discover whether waterbirth is the right fit for you. Education about waterbirth itself, supplies you would need and how to assemble your support team is what you can  expect from this class. Some obstetrical practices require this class in order to pursue a waterbirth. (Not all obstetrical practices offer waterbirth-check with your healthcare provider.) Register only the expectant mom, but you are encouraged to bring your partner to class! Required if planning waterbirth, no fee. Infant/Child CPR: Parents, grandparents, babysitters, and friends learn Cardio-Pulmonary Resuscitation skills for infants and children. You will also learn how to treat both conscious and unconscious choking in infants and children. This Family & Friends program does not offer certification. Register each participant individually to ensure that enough mannequins are available. (Call for fee) Grandparent Love: Expecting a grandbaby? This class is for you! Learn about the latest infant care and safety recommendations and ways to support your own child as he or she transitions into the parenting role. Taught by Registered Nurses who are childbirth instructors, but most importantly...they are grandmothers too! $10/person. Childbirth Class- Natural Childbirth: This series of 5 weekly classes is for expectant parents who want to learn and practice natural methods of coping with the process of labor and childbirth. Relaxation, breathing, massage, visualization, role of the partner, and helpful positioning are highlighted. Participants learn how to be confident in their body's ability to give birth. This class will empower and help parents make informed decisions about their own care. Includes discussion that will help new parents transition into the immediate postpartum period. Maternity Care Center Tour of [facility] is included. We suggest taking this class between 25-32 weeks, but it's only a recommendation. $75 per registrant and one support person or $30 Medicaid. Childbirth Class- 3 week Series: This option of 3 weekly classes helps you and your labor partner prepare for childbirth. Newborn  care, labor & birth, cesarean birth, pain management, and comfort techniques are discussed and a Maternity Care Center Tour of [facility] is included. The class meets at the same time, on the same day of the week for 3 consecutive weeks beginning with the starting date you choose. $60 for registrant and one support person.  Marvelous Multiples: Expecting twins, triplets, or more? This class covers the differences in labor, birth, parenting, and breastfeeding issues that face multiples' parents. NICU tour is included. Led by a Certified Childbirth Educator who is the mother of twins. No fee. Caring for Baby: This class is for expectant and adoptive parents who want to learn and practice the most up-to-date newborn care for their babies. Focus is on birth through the first six weeks of life. Topics include feeding, bathing, diapering, crying, umbilical cord care, circumcision care and safe sleep. Parents learn to recognize symptoms of illness and when to call the pediatrician. Register only the mom-to-be and your partner or support person can plan to come with you! $10 per registrant and support person Childbirth Class- online option: This online class offers you the freedom to complete a Birth and Baby series in the comfort of your own home. The flexibility of this option allows you to review sections at your own pace, at times convenient to you and your support people. It includes additional video information, animations, quizzes, and extended activities. Get organized with helpful eClass tools, checklists, and trackers. Once you register online for the class, you will receive an email within a few days to accept the invitation and begin the class when the time   is right for you. The content will be available to you for 60 days. $60 for 60 days of online access for you and your support people.  Local Doulas: Natural Baby Doulas [email or web address] Tel:  [phone] [email or web address] [facility] [phone] [email or web address] The Labor Ladies  (also do waterbirth tub rental) [phone] [email or web address] [facility] [phone] [email or web address] Sacred Rhythms  [phone] [email or web address] [place] [employer] (PADA) [email or web address] http://www.padanc.org/index.htm [place] Birth and Baby  [email or web address] Considering Waterbirth? Guide for patients at [facility]  Why consider waterbirth?  . Gentle birth for babies . Less pain medicine used in labor . May allow for passive descent/less pushing . May reduce perineal tears  . More mobility and instinctive maternal position changes . Increased maternal relaxation . Reduced blood pressure in labor  Is waterbirth safe? What are the risks of infection, drowning or other complications?  . Infection: o Very low risk (3.7 % for tub vs 4.8% for bed) o 7 in 8000 waterbirths with documented infection o Poorly cleaned equipment most common cause o Slightly lower group B strep transmission rate  . Drowning o Maternal:  - Very low risk   - Related to seizures or fainting o Newborn:  - Very low risk. No evidence of increased risk of respiratory problems in multiple large studies - Physiological protection from breathing under water - Avoid underwater birth if there are any fetal complications - Once baby's head is out of the water, keep it out.  . Birth complication o Some reports of cord trauma, but risk decreased by bringing baby to surface gradually o No evidence of increased risk of shoulder dystocia. Mothers can usually change positions faster in water than in a bed, possibly aiding the maneuvers to free the shoulder.   You must attend a Waterbirth class at [facility]  3rd Wednesday of every month from 7-9pm  Free  Register by calling [phone] or online at [email or web address]  Bring us the certificate from the class to your prenatal appointment  Meet with a midwife at 36 weeks to see if you can still plan a waterbirth and to sign the consent.   Purchase or rent the following supplies:   Water Birth Pool (Birth Pool in a Box or [place] for instance)  (Tubs start ~$125)  Single-use disposable tub liner designed for your brand of tub  New garden hose labeled "lead-free", "suitable for drinking water",  Electric drain pump to remove water (We recommend 792 gallon per hour or greater pump.)   Separate garden hose to remove the dirty water  Fish net  Bathing suit top (optional)  Long-handled mirror (optional)  Places to purchase or rent supplies  [email or web address] for tub purchases and supplies  Waterbirthsolutions.com for tub purchases and supplies  The Labor Ladies (www.[email or web address]) $275 for tub rental/set-up & take down/kit   [place] [employer] (http://www.padanc.org/MeetUs.htm) Information regarding doulas (labor support) who provide pool rentals  Our practice has a Birth Pool in a Box tub at the hospital that you may borrow on a first-come-first-served basis. It is your responsibility to to set up, clean and break down the tub. We cannot guarantee the availability of this tub in advance. You are responsible for bringing all accessories listed above. If you do not have all necessary supplies you cannot have a waterbirth.    Things that would prevent you from having a waterbirth:  Premature, <37wks  Previous cesarean birth  Presence of thick meconium-stained fluid  Multiple gestation (Twins,   triplets, etc.)  Uncontrolled diabetes or gestational diabetes requiring medication  Hypertension requiring medication or diagnosis of pre-eclampsia  Heavy vaginal bleeding  Non-reassuring fetal  heart rate  Active infection (MRSA, etc.). Group B Strep is NOT a contraindication for  waterbirth.  If your labor has to be induced and induction method requires continuous  monitoring of the baby's heart rate  Other risks/issues identified by your obstetrical provider  Please remember that birth is unpredictable. Under certain unforeseeable circumstances your provider may advise against giving birth in the tub. These decisions will be made on a case-by-case basis and with the safety of you and your baby as our highest priority.   AREA PEDIATRIC/FAMILY PRACTICE PHYSICIANS  [facility] [address], [address] [place], [place]  [address] Phone - [phone]   Fax - [phone]  [facility] [address] [place], [place] [address] Phone - [phone]   Fax - [phone]  [facility] [address] [place], [place]  [address] Phone - [phone]   Fax - [phone]  [facility] [address] [place], [place]  [address] Phone - [phone]   Fax - [phone]  [facility] [address] [place], [place]  [address] Phone - [phone]   Fax - [phone]  [facility] [address] [place], [place]  [address] Phone - [phone]   Fax - [phone]  [facility] [address] [place], [place]  [address] Phone - [phone]   Fax - [phone]  [facility] [address] [place], [place]  [address] Phone - [phone]   Fax - [phone]  [facility] [address] [place], [place]  [address] Phone - [phone]   Fax - [phone] [facility] [address] [place], [place]  [address] Phone - [phone]   Fax - [phone]  [facility] [address] [place], [place]  [address] Phone -  [phone]   Fax - [phone]  [facility] [address] [place], [place]  [address] Phone - [phone]   Fax - [phone]  [facility] [address] [place], [place]  [address] Phone - [phone]   Fax - [phone]  SHILPA GOSRANI [address] [place], [place]  [address] Phone - [phone]  [facility] [address] [place], [place]  [address] Phone - [phone]   Fax - [phone]  [facility] [address] [place], [place]  [address] Phone - [phone]   Fax - [phone]  [facility] [address] [place], [place]  [address] Phone - [phone]   Fax - [phone]  [facility] [address] [place], [place]  [address] Phone - [phone]   Fax - [phone]   [facility] [address] [place], [place]  [address] Phone - [phone]   Fax - [phone]  [facility] [address] [place],   [address] Phone - [phone]   Fax - [phone]  [facility] RUB[facility] [address] [place],   [address] Phone - [phone]   Fax - [phone]  [facility] [address] [place],   [address] Phone - [phone]   Fax - [phone]  [facility] [address] [place], [place]  [address] Phone - [phone]   Fax - [phone] [facility] [address] [place], [place]  [address] Phone - [phone]   Fax - [phone]  [facility] [address] [place], [place]  [address] Phone - [phone]   Fax - [phone]  [facility] [address] [place], [place]  [address] Phone - [phone]   Fax - [phone]  [facility] Charlene Flemming MD [address] [place] [address] Phone [phone]  Fax [phone]     

## 2017-08-19 NOTE — Progress Notes (Signed)
Having contractions every day and yesterday had five to six contractions.

## 2017-08-20 NOTE — Progress Notes (Signed)
   PRENATAL VISIT NOTE  Subjective:  Kimberly Macias is a 37 y.o. [id number] at [redacted]w[redacted]d being seen today for ongoing prenatal care.  She is currently monitored for the following issues for this low-risk pregnancy and has Supervision of other normal pregnancy, antepartum; History of gestational diabetes; AMA (advanced maternal age) multigravida 48+, first trimester; Syncope; Anemia; and Depression affecting pregnancy on their problem list.  Patient reports no complaints.  Contractions: Irregular. Vag. Bleeding: None.  Movement: Present. Denies leaking of fluid.   The following portions of the patient's history were reviewed and updated as appropriate: allergies, current medications, past family history, past medical history, past social history, past surgical history and problem list. Problem list updated.  Objective:   Vitals:   08/19/17 1055  BP: 111/61  Pulse: 89  Weight: 148 lb 3.2 oz (67.2 kg)    Fetal Status: Fetal Heart Rate (bpm): 140 Fundal Height: 30 cm Movement: Present     General:  Alert, oriented and cooperative. Patient is in no acute distress.  Skin: Skin is warm and dry. No rash noted.   Cardiovascular: Normal heart rate noted  Respiratory: Normal respiratory effort, no problems with respiration noted  Abdomen: Soft, gravid, appropriate for gestational age.  Pain/Pressure: Absent     Pelvic: Cervical exam deferred        Extremities: Normal range of motion.  Edema: None  Mental Status: Normal mood and affect. Normal behavior. Normal judgment and thought content.   Assessment and Plan:  Pregnancy: G4P1021 at [redacted]w[redacted]d  1. Supervision of other normal pregnancy, antepartum -doing well -will check CBC next visit -- 1 month s/p feraheme -reports feeling much better since feraheme  Preterm labor symptoms and general obstetric precautions including but not limited to vaginal bleeding, contractions, leaking of fluid and fetal movement were reviewed in detail with the  patient. Please refer to After Visit Summary for other counseling recommendations.  Return in about 2 weeks (around 09/02/2017) for [place], next visit should be [redacted] wks gestation.  Future Appointments  Date Time Provider [place]  09/03/2017  9:55 AM Jorje Guild, NP [facility] [place]  09/17/2017  9:15 AM Jorje Guild, NP [facility] [place]  10/01/2017  8:15 AM Jorje Guild, NP [facility] [place]  10/07/2017  8:15 AM Jorje Guild, NP [facility] [place]  10/14/2017  8:15 AM Jorje Guild, NP [facility]    Jorje Guild, NP

## 2017-09-03 ENCOUNTER — Encounter: Payer: Self-pay | Admitting: Student

## 2017-09-07 ENCOUNTER — Encounter: Payer: Self-pay | Admitting: Student

## 2017-09-11 ENCOUNTER — Encounter: Payer: Self-pay | Admitting: Student

## 2017-09-14 ENCOUNTER — Encounter: Payer: Self-pay | Admitting: Student

## 2017-09-17 ENCOUNTER — Ambulatory Visit (INDEPENDENT_AMBULATORY_CARE_PROVIDER_SITE_OTHER): Payer: Medicaid Other | Admitting: Student

## 2017-09-17 ENCOUNTER — Encounter: Payer: Self-pay | Admitting: Family Medicine

## 2017-09-17 VITALS — BP 96/77 | HR 92 | Wt 154.7 lb

## 2017-09-17 DIAGNOSIS — O99013 Anemia complicating pregnancy, third trimester: Secondary | ICD-10-CM

## 2017-09-17 DIAGNOSIS — Z3480 Encounter for supervision of other normal pregnancy, unspecified trimester: Secondary | ICD-10-CM

## 2017-09-17 DIAGNOSIS — O2343 Unspecified infection of urinary tract in pregnancy, third trimester: Secondary | ICD-10-CM

## 2017-09-17 LAB — POCT URINALYSIS DIP (DEVICE)
Bilirubin Urine: NEGATIVE
GLUCOSE, UA: NEGATIVE mg/dL
Hgb urine dipstick: NEGATIVE
KETONES UR: NEGATIVE mg/dL
Nitrite: POSITIVE — AB
PROTEIN: 30 mg/dL — AB
SPECIFIC GRAVITY, URINE: 1.025 (ref 1.005–1.030)
Urobilinogen, UA: 1 mg/dL (ref 0.0–1.0)
pH: 6 (ref 5.0–8.0)

## 2017-09-17 MED ORDER — NITROFURANTOIN MONOHYD MACRO 100 MG PO CAPS: 100.0000 mg | ORAL_CAPSULE | Freq: Two times a day (BID) | ORAL | 0 refills | Status: DC

## 2017-09-17 NOTE — Progress Notes (Signed)
Completed meds for uti since last visit, will do ua today to see if uti cleared. Denies c/o uti. C/o a lot of pressure and braxton hicks uc's.

## 2017-09-17 NOTE — Progress Notes (Signed)
   PRENATAL VISIT NOTE  Subjective:  Kimberly Macias is a 37 y.o. I7P8242 at [redacted]w[redacted]d being seen today for ongoing prenatal care.  She is currently monitored for the following issues for this high-risk pregnancy and has Supervision of other normal pregnancy, antepartum; History of gestational diabetes; AMA (advanced maternal age) multigravida 60+, first trimester; Syncope; Anemia affecting pregnancy in third trimester; Depression affecting pregnancy; and Urinary tract infection in mother during third trimester of pregnancy on their problem list.  Patient reports pelvic pressure and increased urinary frequency. Was treated for UTI last month and completed abx as prescribed. Denies dysuria, flank pain, hematuria, or fever/chills. Feels occasional braxton hicks; especially at night. None today. No vaginal bleeding or LOF.  Contractions: Irregular. Vag. Bleeding: None.  Movement: Present. Denies leaking of fluid.   The following portions of the patient's history were reviewed and updated as appropriate: allergies, current medications, past family history, past medical history, past social history, past surgical history and problem list. Problem list updated.  Objective:   Vitals:   09/17/17 0949  BP: 96/77  Pulse: 92  Weight: 154 lb 11.2 oz (70.2 kg)    Fetal Status: Fetal Heart Rate (bpm): 145 Fundal Height: 33 cm Movement: Present     General:  Alert, oriented and cooperative. Patient is in no acute distress.  Skin: Skin is warm and dry. No rash noted.   Cardiovascular: Normal heart rate noted  Respiratory: Normal respiratory effort, no problems with respiration noted  Abdomen: Soft, gravid, appropriate for gestational age.  Pain/Pressure: Present   No CVAT.    Pelvic: Cervical exam performed Dilation: 1 Effacement (%): 40 Station: -3  Extremities: Normal range of motion.     Mental Status: Normal mood and affect. Normal behavior. Normal judgment and thought content.   Assessment and Plan:    Pregnancy: G4P1021 at [redacted]w[redacted]d  1. Supervision of other normal pregnancy, antepartum -SVE today for pelvic pressure, 1/40/-3. No ctx. No hx of PTD. Discussed reasons to go to [facility] for PTL.   2. Anemia affecting pregnancy in third trimester -1 month s/p feraheme, reassess labs today. Reports feeling "much better" since infusions.  - CBC  3. Urinary tract infection in mother during third trimester of pregnancy -No CVAT. - Culture, OB Urine - nitrofurantoin, macrocrystal-monohydrate, (MACROBID) 100 MG capsule; Take 1 capsule (100 mg total) by mouth 2 (two) times daily.  Dispense: 14 capsule; Refill: 0  Preterm labor symptoms and general obstetric precautions including but not limited to vaginal bleeding, contractions, leaking of fluid and fetal movement were reviewed in detail with the patient. Please refer to After Visit Summary for other counseling recommendations.  Return in about 2 weeks (around 10/01/2017) for Routine OB.  Future Appointments  Date Time Provider [place]  10/01/2017  8:35 AM Jorje Guild, NP [facility] [place]  10/07/2017  8:15 AM Jorje Guild, NP [facility] [place]  10/14/2017  8:15 AM Jorje Guild, NP [facility]    Jorje Guild, NP

## 2017-09-17 NOTE — Patient Instructions (Addendum)
Pregnancy and Urinary Tract Infection What is a urinary tract infection? A urinary tract infection (UTI) is an infection of any part of the urinary tract. This includes the kidneys, the tubes that connect your kidneys to your bladder (ureters), the bladder, and the tube that carries urine out of your body (urethra). These organs make, store, and get rid of urine in the body. A UTI can be a bladder infection (cystitis) or a kidney infection (pyelonephritis). This infection may be caused by fungi, viruses, and bacteria. Bacteria are the most common cause of UTIs. You are more likely to develop a UTI during pregnancy because:  The physical and hormonal changes your body goes through can make it easier for bacteria to get into your urinary tract.  Your growing baby puts pressure on your uterus and can affect urine flow.  Does a UTI place my baby at risk? An untreated UTI during pregnancy could lead to a kidney infection, which can cause health problems that could affect your baby. Possible complications of an untreated UTI include:  Having your baby before 37 weeks of pregnancy (premature).  Having a baby with a low birth weight.  Developing high blood pressure during pregnancy (preeclampsia).  What are the symptoms of a UTI? Symptoms of a UTI include:  Fever.  Frequent urination or passing small amounts of urine frequently.  Needing to urinate urgently.  Pain or a burning sensation with urination.  Urine that smells bad or unusual.  Cloudy urine.  Pain in the lower abdomen or back.  Trouble urinating.  Blood in the urine.  Vomiting or being less hungry than normal.  Diarrhea or abdominal pain.  Vaginal discharge.  What are the treatment options for a UTI during pregnancy? Treatment for this condition may include:  Antibiotic medicines that are safe to take during pregnancy.  Other medicines to treat less common causes of UTI.  How can I prevent a UTI?  To prevent a  UTI:  Go to the bathroom as soon as you feel the need.  Always wipe from front to back.  Wash your genital area with soap and warm water daily.  Empty your bladder before and after sex.  Wear cotton underwear.  Limit your intake of high sugar foods or drinks, such as regular soda, juice, and sweets.  Drink 6-8 glasses of water daily.  Do not wear tight-fitting pants.  Do not douche or use deodorant sprays.  Do not drink alcohol, caffeine, or carbonated drinks. These can irritate the bladder.  Contact a health care provider if:  Your symptoms do not improve or get worse.  You have a fever after two days of treatment.  You have a rash.  You have abnormal vaginal discharge.  You have back or side pain.  You have chills.  You have nausea and vomiting. Get help right away if: Seek immediate medical care if you are pregnant and:  You feel contractions in your uterus.  You have lower belly pain.  You have a gush of fluid from your vagina.  You have blood in your urine.  You are vomiting and cannot keep down any medicines or water.  This information is not intended to replace advice given to you by your health care provider. Make sure you discuss any questions you have with your health care provider. Document Released: 08/02/2010 Document Revised: 03/21/2016 Document Reviewed: 02/26/2015 Elsevier Interactive Patient Education  2017 [place] Medications in Pregnancy  Acne: Benzoyl Peroxide Salicylic Acid  Backache/Headache: Tylenol: 2 regular strength every 4 hours OR              2 Extra strength every 6 hours  Colds/Coughs/Allergies: Benadryl (alcohol free) 25 mg every 6 hours as needed Breath right strips Claritin Cepacol throat lozenges Chloraseptic throat spray Cold-Eeze- up to three times per day Cough drops, alcohol free Flonase (by prescription only) Guaifenesin Mucinex Robitussin DM (plain only, alcohol free) Saline  nasal spray/drops Sudafed (pseudoephedrine) & Actifed ** use only after [redacted] weeks gestation and if you do not have high blood pressure Tylenol Vicks Vaporub Zinc lozenges Zyrtec   Constipation: Colace Ducolax suppositories Fleet enema Glycerin suppositories Metamucil Milk of magnesia Miralax Senokot Smooth move tea  Diarrhea: Kaopectate Imodium A-D  *NO pepto Bismol  Hemorrhoids: Anusol Anusol HC Preparation H Tucks  Indigestion: Tums Maalox Mylanta Zantac  Pepcid  Insomnia: Benadryl (alcohol free) 25mg  every 6 hours as needed Tylenol PM Unisom, no Gelcaps  Leg Cramps: Tums MagGel  Nausea/Vomiting:  Bonine Dramamine Emetrol Ginger extract Sea bands Meclizine  Nausea medication to take during pregnancy:  Unisom (doxylamine succinate 25 mg tablets) Take one tablet daily at bedtime. If symptoms are not adequately controlled, the dose can be increased to a maximum recommended dose of two tablets daily (1/2 tablet in the morning, 1/2 tablet mid-afternoon and one at bedtime). Vitamin B6 100mg  tablets. Take one tablet twice a day (up to 200 mg per day).  Skin Rashes: Aveeno products Benadryl cream or 25mg  every 6 hours as needed Calamine Lotion 1% cortisone cream  Yeast infection: Gyne-lotrimin 7 Monistat 7  Gum/tooth pain: Anbesol  **If taking multiple medications, please check labels to avoid duplicating the same active ingredients **take medication as directed on the label ** Do not exceed 4000 mg of tylenol in 24 hours **Do not take medications that contain aspirin or ibuprofen

## 2017-09-18 LAB — CBC
HEMATOCRIT: 38.2 % (ref 34.0–46.6)
Hemoglobin: 12.5 g/dL (ref 11.1–15.9)
MCH: 29.3 pg (ref 26.6–33.0)
MCHC: 32.7 g/dL (ref 31.5–35.7)
MCV: 90 fL (ref 79–97)
PLATELETS: 234 10*3/uL (ref 150–450)
RBC: 4.26 x10E6/uL (ref 3.77–5.28)
RDW: 25.4 % — ABNORMAL HIGH (ref 12.3–15.4)
WBC: 4.3 10*3/uL (ref 3.4–10.8)

## 2017-09-21 ENCOUNTER — Other Ambulatory Visit: Payer: Self-pay | Admitting: Student

## 2017-09-21 ENCOUNTER — Telehealth: Payer: Self-pay | Admitting: Student

## 2017-09-21 ENCOUNTER — Encounter (INDEPENDENT_AMBULATORY_CARE_PROVIDER_SITE_OTHER): Payer: Self-pay

## 2017-09-21 DIAGNOSIS — O2343 Unspecified infection of urinary tract in pregnancy, third trimester: Secondary | ICD-10-CM

## 2017-09-21 LAB — CULTURE, OB URINE

## 2017-09-21 LAB — URINE CULTURE, OB REFLEX

## 2017-09-21 MED ORDER — CLINDAMYCIN HCL 300 MG PO CAPS: 300.0000 mg | ORAL_CAPSULE | Freq: Three times a day (TID) | ORAL | 0 refills | Status: AC

## 2017-09-21 NOTE — Telephone Encounter (Signed)
No answer. Will send msg through my chart

## 2017-09-28 ENCOUNTER — Encounter: Payer: Self-pay | Admitting: Student

## 2017-10-01 ENCOUNTER — Other Ambulatory Visit (HOSPITAL_COMMUNITY)
Admission: RE | Admit: 2017-10-01 | Discharge: 2017-10-01 | Disposition: A | Discharge status: Home / Self Care | Payer: Medicaid Other | Source: Ambulatory Visit | Attending: Student | Admitting: Student

## 2017-10-01 ENCOUNTER — Ambulatory Visit (INDEPENDENT_AMBULATORY_CARE_PROVIDER_SITE_OTHER): Payer: Medicaid Other | Admitting: Student

## 2017-10-01 VITALS — BP 107/78 | HR 75 | Wt 157.7 lb

## 2017-10-01 DIAGNOSIS — Z3480 Encounter for supervision of other normal pregnancy, unspecified trimester: Secondary | ICD-10-CM

## 2017-10-01 DIAGNOSIS — O09521 Supervision of elderly multigravida, first trimester: Secondary | ICD-10-CM | POA: Insufficient documentation

## 2017-10-01 NOTE — Progress Notes (Signed)
   PRENATAL VISIT NOTE  Subjective:  Kimberly Macias is a 37 y.o. [id number] at [redacted]w[redacted]d being seen today for ongoing prenatal care.  She is currently monitored for the following issues for this high-risk pregnancy and has Supervision of other normal pregnancy, antepartum; History of gestational diabetes; AMA (advanced maternal age) multigravida 56+, first trimester; Syncope; Anemia affecting pregnancy in third trimester; Depression affecting pregnancy; and Urinary tract infection in mother during third trimester of pregnancy on their problem list.  Patient reports no complaints.  Contractions: Irregular.  .  Movement: Present. Denies leaking of fluid.   The following portions of the patient's history were reviewed and updated as appropriate: allergies, current medications, past family history, past medical history, past social history, past surgical history and problem list. Problem list updated.  Objective:   Vitals:   10/01/17 0915  BP: 107/78  Pulse: 75  Weight: 157 lb 11.2 oz (71.5 kg)    Fetal Status:   Fundal Height: 37 cm Movement: Present     General:  Alert, oriented and cooperative. Patient is in no acute distress.  Skin: Skin is warm and dry. No rash noted.   Cardiovascular: Normal heart rate noted  Respiratory: Normal respiratory effort, no problems with respiration noted  Abdomen: Soft, gravid, appropriate for gestational age.  Pain/Pressure: Present     Pelvic: Cervical exam performed Dilation: 1.5 Effacement (%): 70 Station: -3  Extremities: Normal range of motion.  Edema: None  Mental Status: Normal mood and affect. Normal behavior. Normal judgment and thought content.   Assessment and Plan:  Pregnancy: G4P1021 at [redacted]w[redacted]d  1. AMA (advanced maternal age) multigravida 30+, first trimester  - Culture, beta strep (group b only) - GC/Chlamydia probe amp ([place])not at [facility]  2. Supervision of other normal pregnancy, antepartum -Has not been taking antibiotic as  prescribed b/c it makes her feels sick (stomach ache, nausea). Has 7 pills left. Denies any urinary complaints, fever/chill, n/v, flank pain. Will check urine culture next visit.   Term labor symptoms and general obstetric precautions including but not limited to vaginal bleeding, contractions, leaking of fluid and fetal movement were reviewed in detail with the patient. Please refer to After Visit Summary for other counseling recommendations.  Return in about 2 weeks (around 10/15/2017) for Routine OB, BRX scheduling.  Future Appointments  Date Time Provider [place]  10/07/2017  8:15 AM Jorje Guild, NP [facility] [place]  10/14/2017  8:15 AM Jorje Guild, NP [facility]    Jorje Guild, NP

## 2017-10-01 NOTE — Patient Instructions (Signed)

## 2017-10-02 LAB — GC/CHLAMYDIA PROBE AMP (~~LOC~~) NOT AT ARMC
CHLAMYDIA, DNA PROBE: NEGATIVE
NEISSERIA GONORRHEA: NEGATIVE

## 2017-10-05 LAB — CULTURE, BETA STREP (GROUP B ONLY): Strep Gp B Culture: NEGATIVE

## 2017-10-06 ENCOUNTER — Inpatient Hospital Stay (EMERGENCY_DEPARTMENT_HOSPITAL)
Admission: AD | Admit: 2017-10-06 | Discharge: 2017-10-06 | Disposition: A | Discharge status: Home / Self Care | Payer: Medicaid Other | Source: Ambulatory Visit | Attending: Obstetrics & Gynecology | Admitting: Obstetrics & Gynecology

## 2017-10-06 ENCOUNTER — Encounter (HOSPITAL_COMMUNITY): Payer: Self-pay | Admitting: *Deleted

## 2017-10-06 ENCOUNTER — Other Ambulatory Visit: Payer: Self-pay

## 2017-10-06 DIAGNOSIS — O2343 Unspecified infection of urinary tract in pregnancy, third trimester: Secondary | ICD-10-CM

## 2017-10-06 DIAGNOSIS — O99013 Anemia complicating pregnancy, third trimester: Secondary | ICD-10-CM

## 2017-10-06 DIAGNOSIS — O471 False labor at or after 37 completed weeks of gestation: Secondary | ICD-10-CM | POA: Diagnosis not present

## 2017-10-06 DIAGNOSIS — Z3480 Encounter for supervision of other normal pregnancy, unspecified trimester: Secondary | ICD-10-CM

## 2017-10-06 DIAGNOSIS — O479 False labor, unspecified: Secondary | ICD-10-CM

## 2017-10-06 DIAGNOSIS — O09521 Supervision of elderly multigravida, first trimester: Secondary | ICD-10-CM

## 2017-10-06 HISTORY — DX: Anemia, unspecified: D64.9

## 2017-10-06 NOTE — MAU Note (Signed)
I have communicated with Wende Bushy, CNM and reviewed vital signs:  Vitals:   10/06/17 1025 10/06/17 1216  BP: 111/71 112/74  Pulse: 76 84  Resp: 18 19  Temp: 98.4 F (36.9 C) 98.3 F (36.8 C)  SpO2:  98%    Vaginal exam:  Dilation: 3 Effacement (%): 80 Station: -2 Presentation: Vertex Exam by:: F. Jinnie Onley, RNC,   Also reviewed contraction pattern and that non-stress test is reactive.  It has been documented that patient is contracting every 5-5.5  minutes with minimal cervical change over 1.5  hours not indicating active labor.  Patient denies any other complaints.  Based on this report provider has given order for discharge.  A discharge order and diagnosis entered by a provider.   Labor discharge instructions reviewed with patient.

## 2017-10-06 NOTE — MAU Note (Signed)
Pt presents with c/o ctxs that began @ 0800 this morning.  Denies LOF, reports bloody show/mucous plug.  Reports +FM.

## 2017-10-06 NOTE — MAU Provider Note (Signed)
LABOR EVALUATION PROVIDER NOTE  Patient presents to MAU for complaints of contractions that started around 0800 this morning, she reports her mucus plug came out around the same time her contractions began. Reports contractions every 5-6 minutes, rates pain 4/10. She reports + fetal movement, denies LOF.  Cervix checked by RN:  Dilation: 3 Effacement (%): 80 Station: -2 Presentation: Vertex Exam by:: F. Morris, RNC  Slight cervical change from 2.5 to 3- possible early labor   FHR: 145/ moderate variability/ +acceleration/ no decel  Toco: 3-4/ moderate by palpation   Okay to discharge home. Follow up as scheduled for prenatal appointments. Return to MAU as needed for labor evaluation or baby is not moving as much.  Lajean Manes, CNM 10/06/17, 12:34 PM

## 2017-10-06 NOTE — Progress Notes (Signed)
Pt informed she may ambulate.  Instructed to remain on this floor and return @ 1210 for re-eval or sooner if ROM or VB.  Pt verbalized understanding & agreeable.

## 2017-10-07 ENCOUNTER — Encounter (HOSPITAL_COMMUNITY): Payer: Self-pay | Admitting: *Deleted

## 2017-10-07 ENCOUNTER — Inpatient Hospital Stay (HOSPITAL_COMMUNITY)
Admission: AD | Admit: 2017-10-07 | Discharge: 2017-10-09 | DRG: 807 | Disposition: A | Discharge status: Home / Self Care | Payer: Medicaid Other | Attending: Family Medicine | Admitting: Family Medicine

## 2017-10-07 ENCOUNTER — Inpatient Hospital Stay (HOSPITAL_COMMUNITY): Payer: Medicaid Other | Admitting: Anesthesiology

## 2017-10-07 ENCOUNTER — Other Ambulatory Visit: Payer: Self-pay

## 2017-10-07 ENCOUNTER — Encounter: Payer: Self-pay | Admitting: Nurse Practitioner

## 2017-10-07 DIAGNOSIS — O09521 Supervision of elderly multigravida, first trimester: Secondary | ICD-10-CM

## 2017-10-07 DIAGNOSIS — D649 Anemia, unspecified: Secondary | ICD-10-CM | POA: Diagnosis present

## 2017-10-07 DIAGNOSIS — Z3A38 38 weeks gestation of pregnancy: Secondary | ICD-10-CM

## 2017-10-07 DIAGNOSIS — O9902 Anemia complicating childbirth: Secondary | ICD-10-CM | POA: Diagnosis present

## 2017-10-07 DIAGNOSIS — O99013 Anemia complicating pregnancy, third trimester: Secondary | ICD-10-CM

## 2017-10-07 DIAGNOSIS — Z3A37 37 weeks gestation of pregnancy: Secondary | ICD-10-CM | POA: Diagnosis not present

## 2017-10-07 DIAGNOSIS — Z3483 Encounter for supervision of other normal pregnancy, third trimester: Secondary | ICD-10-CM | POA: Diagnosis present

## 2017-10-07 DIAGNOSIS — O2343 Unspecified infection of urinary tract in pregnancy, third trimester: Secondary | ICD-10-CM

## 2017-10-07 DIAGNOSIS — Z3480 Encounter for supervision of other normal pregnancy, unspecified trimester: Secondary | ICD-10-CM

## 2017-10-07 LAB — CBC
HCT: 41.2 % (ref 36.0–46.0)
HEMOGLOBIN: 14.1 g/dL (ref 12.0–15.0)
MCH: 30.5 pg (ref 26.0–34.0)
MCHC: 34.2 g/dL (ref 30.0–36.0)
MCV: 89.2 fL (ref 78.0–100.0)
PLATELETS: 202 10*3/uL (ref 150–400)
RBC: 4.62 MIL/uL (ref 3.87–5.11)
RDW: 21.3 % — ABNORMAL HIGH (ref 11.5–15.5)
WBC: 10.3 10*3/uL (ref 4.0–10.5)

## 2017-10-07 LAB — TYPE AND SCREEN
ABO/RH(D): O POS
ANTIBODY SCREEN: NEGATIVE

## 2017-10-07 LAB — RPR: RPR: NONREACTIVE

## 2017-10-07 MED ORDER — IBUPROFEN 600 MG PO TABS
600.0000 mg | ORAL_TABLET | Freq: Four times a day (QID) | ORAL | Status: DC
  Administered 2017-10-07 – 2017-10-09 (×6): 600 mg via ORAL
  Filled 2017-10-07 (×6): qty 1

## 2017-10-07 MED ORDER — EPHEDRINE 5 MG/ML INJ: 10.0000 mg | INTRAVENOUS | Status: DC | PRN

## 2017-10-07 MED ORDER — COCONUT OIL OIL
1.0000 "application " | TOPICAL_OIL | Status: DC | PRN
  Filled 2017-10-07 (×3): qty 120

## 2017-10-07 MED ORDER — OXYTOCIN BOLUS FROM INFUSION: 500.0000 mL | Freq: Once | INTRAVENOUS | Status: DC

## 2017-10-07 MED ORDER — ONDANSETRON HCL 4 MG PO TABS
4.0000 mg | ORAL_TABLET | ORAL | Status: DC | PRN
Start: 2017-10-07 — End: 2017-10-09

## 2017-10-07 MED ORDER — ONDANSETRON HCL 4 MG/2ML IJ SOLN: 4.0000 mg | INTRAMUSCULAR | Status: DC | PRN

## 2017-10-07 MED ORDER — PHENYLEPHRINE 40 MCG/ML (10ML) SYRINGE FOR IV PUSH (FOR BLOOD PRESSURE SUPPORT): 80.0000 ug | PREFILLED_SYRINGE | INTRAVENOUS | Status: DC | PRN

## 2017-10-07 MED ORDER — DIBUCAINE 1 % RE OINT
1.0000 "application " | TOPICAL_OINTMENT | RECTAL | Status: DC | PRN
  Filled 2017-10-07: qty 28

## 2017-10-07 MED ORDER — ONDANSETRON HCL 4 MG/2ML IJ SOLN: 4.0000 mg | Freq: Four times a day (QID) | INTRAMUSCULAR | Status: DC | PRN

## 2017-10-07 MED ORDER — LIDOCAINE HCL (PF) 1 % IJ SOLN
INTRAMUSCULAR | Status: DC | PRN
  Administered 2017-10-07 (×2): 4 mL via EPIDURAL

## 2017-10-07 MED ORDER — LACTATED RINGERS IV SOLN: INTRAVENOUS | Status: DC

## 2017-10-07 MED ORDER — BENZOCAINE-MENTHOL 20-0.5 % EX AERO
1.0000 "application " | INHALATION_SPRAY | CUTANEOUS | Status: DC | PRN
  Filled 2017-10-07 (×3): qty 56

## 2017-10-07 MED ORDER — ACETAMINOPHEN 325 MG PO TABS
650.0000 mg | ORAL_TABLET | ORAL | Status: DC | PRN
Start: 2017-10-07 — End: 2017-10-09
  Administered 2017-10-07 – 2017-10-08 (×2): 650 mg via ORAL

## 2017-10-07 MED ORDER — LIDOCAINE HCL (PF) 1 % IJ SOLN
30.0000 mL | INTRAMUSCULAR | Status: DC | PRN
  Filled 2017-10-07: qty 30

## 2017-10-07 MED ORDER — WITCH HAZEL-GLYCERIN EX PADS: 1.0000 "application " | MEDICATED_PAD | CUTANEOUS | Status: DC | PRN

## 2017-10-07 MED ORDER — PRENATAL MULTIVITAMIN CH
1.0000 | ORAL_TABLET | Freq: Every day | ORAL | Status: DC
  Administered 2017-10-08: 1 via ORAL
  Filled 2017-10-07: qty 1

## 2017-10-07 MED ORDER — ACETAMINOPHEN 325 MG PO TABS
650.0000 mg | ORAL_TABLET | ORAL | Status: DC | PRN
Start: 2017-10-07 — End: 2017-10-09
  Filled 2017-10-07 (×2): qty 2

## 2017-10-07 MED ORDER — LACTATED RINGERS IV SOLN
INTRAVENOUS | Status: DC
  Administered 2017-10-07: 09:00:00 via INTRAVENOUS

## 2017-10-07 MED ORDER — LACTATED RINGERS IV SOLN
500.0000 mL | INTRAVENOUS | Status: DC | PRN
  Administered 2017-10-07: 500 mL via INTRAVENOUS

## 2017-10-07 MED ORDER — FENTANYL CITRATE (PF) 100 MCG/2ML IJ SOLN
100.0000 ug | INTRAMUSCULAR | Status: DC | PRN
  Administered 2017-10-07: 100 ug via INTRAVENOUS
  Filled 2017-10-07: qty 2

## 2017-10-07 MED ORDER — OXYTOCIN BOLUS FROM INFUSION
500.0000 mL | Freq: Once | INTRAVENOUS | Status: AC
  Administered 2017-10-07: 500 mL via INTRAVENOUS

## 2017-10-07 MED ORDER — LIDOCAINE HCL (PF) 1 % IJ SOLN: 30.0000 mL | INTRAMUSCULAR | Status: DC | PRN

## 2017-10-07 MED ORDER — LACTATED RINGERS IV SOLN: 500.0000 mL | Freq: Once | INTRAVENOUS | Status: DC

## 2017-10-07 MED ORDER — OXYCODONE-ACETAMINOPHEN 5-325 MG PO TABS: 1.0000 | ORAL_TABLET | ORAL | Status: DC | PRN

## 2017-10-07 MED ORDER — OXYTOCIN 40 UNITS IN LACTATED RINGERS INFUSION - SIMPLE MED
1.0000 m[IU]/min | INTRAVENOUS | Status: DC
  Administered 2017-10-07: 2 m[IU]/min via INTRAVENOUS
  Filled 2017-10-07: qty 1000

## 2017-10-07 MED ORDER — ZOLPIDEM TARTRATE 5 MG PO TABS: 5.0000 mg | ORAL_TABLET | Freq: Every evening | ORAL | Status: DC | PRN

## 2017-10-07 MED ORDER — FERROUS SULFATE 325 (65 FE) MG PO TABS
325.0000 mg | ORAL_TABLET | Freq: Two times a day (BID) | ORAL | Status: DC
  Administered 2017-10-07 – 2017-10-08 (×3): 325 mg via ORAL
  Filled 2017-10-07 (×5): qty 1

## 2017-10-07 MED ORDER — TETANUS-DIPHTH-ACELL PERTUSSIS 5-2.5-18.5 LF-MCG/0.5 IM SUSP
0.5000 mL | Freq: Once | INTRAMUSCULAR | Status: DC
  Filled 2017-10-07: qty 0.5

## 2017-10-07 MED ORDER — OXYCODONE-ACETAMINOPHEN 5-325 MG PO TABS: 2.0000 | ORAL_TABLET | ORAL | Status: DC | PRN

## 2017-10-07 MED ORDER — FLEET ENEMA 7-19 GM/118ML RE ENEM: 1.0000 | ENEMA | RECTAL | Status: DC | PRN

## 2017-10-07 MED ORDER — TERBUTALINE SULFATE 1 MG/ML IJ SOLN: 0.2500 mg | Freq: Once | INTRAMUSCULAR | Status: DC | PRN

## 2017-10-07 MED ORDER — LACTATED RINGERS IV SOLN: 500.0000 mL | INTRAVENOUS | Status: DC | PRN

## 2017-10-07 MED ORDER — ACETAMINOPHEN 325 MG PO TABS: 650.0000 mg | ORAL_TABLET | ORAL | Status: DC | PRN

## 2017-10-07 MED ORDER — OXYTOCIN 40 UNITS IN LACTATED RINGERS INFUSION - SIMPLE MED
2.5000 [IU]/h | INTRAVENOUS | Status: DC
  Administered 2017-10-07: 2.5 [IU]/h via INTRAVENOUS

## 2017-10-07 MED ORDER — SOD CITRATE-CITRIC ACID 500-334 MG/5ML PO SOLN: 30.0000 mL | ORAL | Status: DC | PRN

## 2017-10-07 MED ORDER — LACTATED RINGERS IV SOLN
500.0000 mL | Freq: Once | INTRAVENOUS | Status: AC
  Administered 2017-10-07: 500 mL via INTRAVENOUS

## 2017-10-07 MED ORDER — FENTANYL 2.5 MCG/ML BUPIVACAINE 1/10 % EPIDURAL INFUSION (WH - ANES)
14.0000 mL/h | INTRAMUSCULAR | Status: DC | PRN
  Administered 2017-10-07: 13 mL/h via EPIDURAL
  Filled 2017-10-07: qty 100

## 2017-10-07 MED ORDER — OXYTOCIN 10 UNIT/ML IJ SOLN: 10.0000 [IU] | Freq: Once | INTRAMUSCULAR | Status: DC | PRN

## 2017-10-07 MED ORDER — PHENYLEPHRINE 40 MCG/ML (10ML) SYRINGE FOR IV PUSH (FOR BLOOD PRESSURE SUPPORT)
80.0000 ug | PREFILLED_SYRINGE | INTRAVENOUS | Status: DC | PRN
  Filled 2017-10-07: qty 10

## 2017-10-07 MED ORDER — DIPHENHYDRAMINE HCL 25 MG PO CAPS: 25.0000 mg | ORAL_CAPSULE | Freq: Four times a day (QID) | ORAL | Status: DC | PRN

## 2017-10-07 MED ORDER — DIPHENHYDRAMINE HCL 50 MG/ML IJ SOLN: 12.5000 mg | INTRAMUSCULAR | Status: DC | PRN

## 2017-10-07 MED ORDER — SENNOSIDES-DOCUSATE SODIUM 8.6-50 MG PO TABS
2.0000 | ORAL_TABLET | ORAL | Status: DC
  Administered 2017-10-07: 2 via ORAL
  Filled 2017-10-07 (×3): qty 2

## 2017-10-07 MED ORDER — SIMETHICONE 80 MG PO CHEW: 80.0000 mg | CHEWABLE_TABLET | ORAL | Status: DC | PRN

## 2017-10-07 MED ORDER — OXYTOCIN 40 UNITS IN LACTATED RINGERS INFUSION - SIMPLE MED: 2.5000 [IU]/h | INTRAVENOUS | Status: DC

## 2017-10-07 NOTE — Anesthesia Procedure Notes (Signed)
Epidural Patient location during procedure: OB Start time: 10/07/2017 10:37 AM  Staffing Anesthesiologist: Josephine Igo, MD  Preanesthetic Checklist Completed: patient identified, site marked, surgical consent, pre-op evaluation, timeout performed, IV checked, risks and benefits discussed and monitors and equipment checked  Epidural Patient position: sitting Prep: site prepped and draped and DuraPrep Patient monitoring: continuous pulse ox and blood pressure Approach: midline Location: L3-L4 Injection technique: LOR air  Needle:  Needle type: Tuohy  Needle gauge: 17 G Needle length: 9 cm and 9 Needle insertion depth: 6 cm Catheter type: closed end flexible Catheter size: 19 Gauge Catheter at skin depth: 10 cm Test dose: negative and Other  Assessment Events: blood not aspirated, injection not painful, no injection resistance, negative IV test and no paresthesia  Additional Notes Patient identified. Risks and benefits discussed including failed block, incomplete  Pain control, post dural puncture headache, nerve damage, paralysis, blood pressure Changes, nausea, vomiting, reactions to medications-both toxic and allergic and post Partum back pain. All questions were answered. Patient expressed understanding and wished to proceed. Sterile technique was used throughout procedure. Epidural site was Dressed with sterile barrier dressing. No paresthesias, signs of intravascular injection Or signs of intrathecal spread were encountered. Attempt x 2. Difficult due to patient positioning. Patient was more comfortable after the epidural was dosed. Please see RN's note for documentation of vital signs and FHR which are stable.

## 2017-10-07 NOTE — Lactation Note (Signed)
This note was copied from a baby's chart. Lactation Consultation Note  Patient Name: Kimberly Macias [id number] Date: 10/07/2017 Reason for consult: Initial assessment;1st time breastfeeding;Early term 37-38.6wks  P2 mom first time breastfeeding.  Wishes to do both breast and bottle feed formula.  Infant in blankets.  LC reviewed BF basics, taught hand expression, mom states she has very sensitive nipples.  Unable to see any drops at this time.  LC assists mom in latching infant in football hold.  Infant latched well but mom had moderate discomfort with 1 minute of feeding even after taken off and latched again.  A swallow was heard with massage but infant taken off due to pain.  When [place] tried to reposition infant in cross cradle and on other breast infant would not feed.    Mom fed infant 10 ml of formula earlier.  Infant stomach size information given.    LC encouraged mom to call out with next bf so assistance can be given to help with comfort.    BFSG information info and out Pt. Lactation info given as well as Secretary/administrator.     Maternal Data Has patient been taught Hand Expression?: Yes Does the patient have breastfeeding experience prior to this delivery?: No  Feeding Feeding Type: Breast Fed Length of feed: 1 min(too painful for mom after 1 minute/repositioned then infant would not latch; )  LATCH Score Latch: Repeated attempts needed to sustain latch, nipple held in mouth throughout feeding, stimulation needed to elicit sucking reflex.  Audible Swallowing: A few with stimulation  Type of Nipple: Everted at rest and after stimulation  Comfort (Breast/Nipple): Filling, red/small blisters or bruises, mild/mod discomfort  Hold (Positioning): Assistance needed to correctly position infant at breast and maintain latch.  LATCH Score: 6  Interventions Interventions: Breast feeding basics reviewed;Assisted with latch;Skin to skin;Breast massage;Hand express;Position  options;Support pillows  Lactation Tools Discussed/Used     Consult Status Consult Status: Follow-up Date: 10/08/17 Follow-up type: In-patient    Ferne Coe [facility] 10/07/2017, 7:17 PM

## 2017-10-07 NOTE — Anesthesia Preprocedure Evaluation (Addendum)
Anesthesia Evaluation  Patient identified by MRN, date of birth, ID band Patient awake    Reviewed: Allergy & Precautions, Patient's Chart, lab work & pertinent test results  Airway Mallampati: II  TM Distance: >3 FB Neck ROM: Full    Dental no notable dental hx. (+) Teeth Intact   Pulmonary neg pulmonary ROS,    Pulmonary exam normal breath sounds clear to auscultation       Cardiovascular negative cardio ROS Normal cardiovascular exam Rhythm:Regular Rate:Normal     Neuro/Psych PSYCHIATRIC DISORDERS Depression negative neurological ROS     GI/Hepatic Neg liver ROS, GERD  ,  Endo/Other  diabetes, Well Controlled, GestationalHx/o gestational DM in previous pregnancy  Renal/GU negative Renal ROS  negative genitourinary   Musculoskeletal negative musculoskeletal ROS (+)   Abdominal   Peds  Hematology  (+) anemia ,   Anesthesia Other Findings   Reproductive/Obstetrics (+) Pregnancy                            Anesthesia Physical Anesthesia Plan  ASA: II  Anesthesia Plan: Epidural   Post-op Pain Management:    Induction:   PONV Risk Score and Plan:   Airway Management Planned: Natural Airway  Additional Equipment:   Intra-op Plan:   Post-operative Plan:   Informed Consent: I have reviewed the patients History and Physical, chart, labs and discussed the procedure including the risks, benefits and alternatives for the proposed anesthesia with the patient or authorized representative who has indicated his/her understanding and acceptance.     Plan Discussed with: Anesthesiologist  Anesthesia Plan Comments:         Anesthesia Quick Evaluation

## 2017-10-07 NOTE — Progress Notes (Signed)
Pt to ambulate and be rechecked in 1 hour for cervical change.

## 2017-10-07 NOTE — H&P (Addendum)
LABOR AND DELIVERY ADMISSION HISTORY AND PHYSICAL NOTE  Kimberly Macias is a 37 y.o. female [phone] with IUP at [redacted]w[redacted]d by LMP/6 wk [place] presenting for SOL/SROM around 0800 this AM.  She reports positive fetal movement. She denies leakage of fluid or vaginal bleeding.  Prenatal History/Complications: PNC at [facility] Pregnancy complications:  - AMA - anemia on Fe - depression - UTI in third trimester, s/p treatment - h/o GDM in prior pregnancy  Past Medical History: Past Medical History:  Diagnosis Date  . Anemia   . Gestational diabetes 2010   giet controlled    Past Surgical History: Past Surgical History:  Procedure Laterality Date  . DIAGNOSTIC LAPAROSCOPY WITH REMOVAL OF ECTOPIC PREGNANCY N/A 07/05/2016   Procedure: DIAGNOSTIC LAPAROSCOPY WITH REMOVAL OF ECTOPIC PREGNANCY;  Surgeon: [name], MD;  Location: [facility];  Service: Gynecology;  Laterality: N/A;  . LAPAROSCOPIC UNILATERAL SALPINGECTOMY Left 07/05/2016   Procedure: LAPAROSCOPIC UNILATERAL SALPINGECTOMY;  Surgeon: [name], MD;  Location: [facility];  Service: Gynecology;  Laterality: Left;    Obstetrical History: OB History    Gravida  4   Para  1   Term  1   Preterm  0   AB  2   Living  1     SAB  0   TAB  1   Ectopic  1   Multiple  0   Live Births  1           Social History: Social History   Socioeconomic History  . Marital status: Single    Spouse name: Not on file  . Number of children: Not on file  . Years of education: Not on file  . Highest education level: Not on file  Occupational History  . Not on file  Social Needs  . Financial resource strain: Not on file  . Food insecurity:    Worry: Not on file    Inability: Not on file  . Transportation needs:    Medical: Not on file    Non-medical: Not on file  Tobacco Use  . Smoking status: Never Smoker  . Smokeless tobacco: Never Used  Substance and Sexual Activity  . Alcohol use: No    Frequency: Never  . Drug use: No  .  Sexual activity: Yes    Birth control/protection: None  Lifestyle  . Physical activity:    Days per week: Not on file    Minutes per session: Not on file  . Stress: Not on file  Relationships  . Social connections:    Talks on phone: Not on file    Gets together: Not on file    Attends religious service: Not on file    Active member of club or organization: Not on file    Attends meetings of clubs or organizations: Not on file    Relationship status: Not on file  Other Topics Concern  . Not on file  Social History Narrative  . Not on file    Family History: History reviewed. No pertinent family history.  Allergies: No Known Allergies  Medications Prior to Admission  Medication Sig Dispense Refill Last Dose  . ferrous sulfate 325 (65 FE) MG tablet Take 1 tablet (325 mg total) by mouth 2 (two) times daily with a meal. (Patient not taking: Reported on 10/06/2017) 60 tablet 3 Not Taking at Unknown time  . nitrofurantoin, macrocrystal-monohydrate, (MACROBID) 100 MG capsule Take 1 capsule (100 mg total) by mouth 2 (two) times daily. [place]  capsule 0 10/05/2017 at Unknown time  . Prenatal MV & Min w/FA-DHA (PRENATAL ADULT GUMMY/DHA/FA) 0.4-25 MG CHEW Chew 1 Piece by mouth daily. (Patient not taking: Reported on 10/06/2017) 30 tablet 8 Not Taking at Unknown time     Review of Systems  All systems reviewed and negative except as stated in HPI  Physical Exam Blood pressure 117/76, pulse 82, temperature 98.2 F (36.8 C), temperature source Oral, resp. rate 20, last menstrual period 01/19/2017, SpO2 100 %, unknown if currently breastfeeding. General appearance: alert, oriented, NAD Lungs: normal respiratory effort Heart: regular rate Abdomen: soft, non-tender; gravid, FH appropriate for GA Extremities: No calf swelling or tenderness Presentation: cephalic Fetal monitoring: 120 bpm/moderate variability/+ accel, early decels Uterine activity: q2 mins Dilation: 4.5 Effacement (%):  90 Station: -1 Exam by:: F. Morris  Prenatal labs: ABO, Rh: O/Positive/-- (01/02 1003) Antibody: Negative (01/02 1003) Rubella: 2.27 (01/02 1003) RPR: Non Reactive (04/16 1007)  HBsAg: Negative (01/02 1003)  HIV: Non Reactive (04/16 1007)  GC/Chlamydia: negative GBS:   negative 2-hr GTT: negative (81/154/147) Genetic screening:  NIPS low risk Anatomy US: normal female  Prenatal Transfer Tool  Maternal Diabetes: No Genetic Screening: Normal Maternal Ultrasounds/Referrals: Normal Fetal Ultrasounds or other Referrals:  None Maternal Substance Abuse:  No Significant Maternal Medications:  None Significant Maternal Lab Results: None  Results for orders placed or performed during the hospital encounter of 10/07/17 (from the past 24 hour(s))  CBC   Collection Time: 10/07/17  9:08 AM  Result Value Ref Range   WBC 10.3 4.0 - 10.5 K/uL   RBC 4.62 3.87 - 5.11 MIL/uL   Hemoglobin 14.1 12.0 - 15.0 g/dL   HCT 41.2 36.0 - 46.0 %   MCV 89.2 78.0 - 100.0 fL   MCH 30.5 26.0 - 34.0 pg   MCHC 34.2 30.0 - 36.0 g/dL   RDW 21.3 (H) 11.5 - 15.5 %   Platelets 202 150 - 400 K/uL    Patient Active Problem List   Diagnosis Date Noted  . Indication for care in labor or delivery 10/07/2017  . Normal labor 10/07/2017  . Urinary tract infection in mother during third trimester of pregnancy 09/17/2017  . Syncope 07/07/2017  . Anemia affecting pregnancy in third trimester 07/07/2017  . Depression affecting pregnancy 07/07/2017  . Supervision of other normal pregnancy, antepartum 04/22/2017  . History of gestational diabetes 04/22/2017  . AMA (advanced maternal age) multigravida 31+, first trimester 04/22/2017    Assessment: Kimberly Macias is a 37 y.o. [id number] at [redacted]w[redacted]d here for SROM/SOL.  #Labor: SVE on admission 4, now progressed to 5/100/-3. SROM at 0800. Expectant management. Anticipate SVD.  #Pain: Per patient request, desires epidural #FWB: Cat 1 FHT #ID:  negative #MOF:  undecided #MOC:undecided #Circ:  Yes  Kimberly Macias 10/07/2017, 9:25 AM  OB FELLOW HISTORY AND PHYSICAL ATTESTATION I have seen and examined this patient; I agree with above documentation in the resident's note.   [id number] at [redacted]w[redacted]d here for early labor with SROM.  Gailen Shelter, MD OB Fellow 10/07/2017, 12:03 PM

## 2017-10-07 NOTE — Progress Notes (Signed)
CSW received consult for hx of Depression.  Upon chart review, CSW notes that MOB was seen by [facility] for "feeling a little sad" during pregnancy.  There is no evidence of a depression dx.  Please consult CSW if current concerns arise, by MOB's request, or if MOB scores greater than 9 or positive to questions 10 on the Edinburgh Postnatal Depression Scale.

## 2017-10-08 ENCOUNTER — Encounter (HOSPITAL_COMMUNITY): Payer: Self-pay | Admitting: General Practice

## 2017-10-08 NOTE — Progress Notes (Signed)
Pt has not yet voided in the specimen cup . Pt states that she keeps forgetting to void in cup when she uses the bathroom.  RN will continue to remind pt.

## 2017-10-08 NOTE — Lactation Note (Signed)
This note was copied from a baby's chart. Lactation Consultation Note  Patient Name: Girl Erum Cercone [id number] Date: 10/08/2017 Reason for consult: Follow-up assessment;Infant weight loss;Early term 37-38.6wks;Other (Comment)(3% weight loss / LC enc mom to call with feeding cues if  desire to latch at the breast / less than 6 pounds today )  Feeding preference - Breast / formula, LC noted on doc flow sheets mom has been feeding mostly bottles.  Per mom I still want to work on the breast feeding, but I'm  tired so for now bottle feeding.  Per mom WIC has been in today and signed up for the formula packet. LC informed mom since she has the formula  Packet would not be able to have a [facility] loaner pump from [facility].  Dad was asking how much a DEBP cost and was looking up the prices in [employer].  LC reviewed supply and demand and the importance of consistent stimulation.  LC explored options with mom and mentioned due to the baby being less than 6 pounds now, to feed the baby  At the breast 15 -10 mins/ supplement/ and if she desires to ask RN to have a DEBP set up for post  Pumping both breast for 15 -20  Mins./ save milk for next feeding.  LC also enc mom to call on the nurses light for feeding assessment if she desires to latch her baby.   LC unable to observe latch . Baby recently fed 15 ml and is asleep.     Maternal Data    Feeding Feeding Type: (baby just fed at 1450 - 15 ml )  LATCH Score                   Interventions Interventions: Breast feeding basics reviewed  Lactation Tools Discussed/Used WIC Program: Yes(per mom the [facility] reps were in to sign her up and she got the formula packet )   Consult Status Consult Status: Follow-up Date: 10/09/17 Follow-up type: In-patient    [place] 10/08/2017, 3:16 PM

## 2017-10-08 NOTE — Progress Notes (Signed)
POSTPARTUM PROGRESS NOTE  Post Partum Day 1 Subjective:  Kimberly Macias is a 37 y.o. [id number] [redacted]w[redacted]d s/p 1.  No acute events overnight.  Pt denies problems with ambulating, voiding or po intake.  She denies nausea or vomiting.  Pain is well controlled.  She has had flatus. She has had bowel movement.  Lochia Minimal.   Objective: Blood pressure 105/67, pulse 72, temperature 98 F (36.7 C), temperature source Oral, resp. rate 18, height 5\' 3"  (1.6 m), weight 158 lb (71.7 kg), last menstrual period 01/19/2017, SpO2 100 %, unknown if currently breastfeeding.  Physical Exam:  General: alert, cooperative and no distress Lochia:normal flow Chest: CTAB Heart: RRR no m/r/g Abdomen: +BS, soft, nontender,  Uterine Fundus: firm DVT Evaluation: No calf swelling or tenderness Extremities: no edema  Recent Labs    10/07/17 0908  HGB 14.1  HCT 41.2    Assessment/Plan:  ASSESSMENT: Kimberly Macias is a 37 y.o. [id number] [redacted]w[redacted]d s/p SVD  Plan for discharge tomorrow   LOS: 1 day   Bonnita Hollow, MD 10/08/2017, 8:55 AM

## 2017-10-08 NOTE — Anesthesia Postprocedure Evaluation (Signed)
Anesthesia Post Note  Patient: Kimberly Macias  Procedure(s) Performed: AN AD [place]     Patient location during evaluation: Mother Baby Anesthesia Type: Epidural Level of consciousness: awake and alert and oriented Pain management: pain level controlled Vital Signs Assessment: post-procedure vital signs reviewed and stable Respiratory status: nonlabored ventilation, respiratory function stable and spontaneous breathing Cardiovascular status: blood pressure returned to baseline and stable Postop Assessment: patient able to bend at knees and no apparent nausea or vomiting Anesthetic complications: no    Last Vitals:  Vitals:   10/07/17 2356 10/08/17 0651  BP: (!) 103/59 105/67  Pulse: 72 72  Resp: 18 18  Temp: 36.7 C 36.7 C  SpO2:      Last Pain:  Vitals:   10/08/17 1129  TempSrc:   PainSc: 5                  [name] A.

## 2017-10-09 MED ORDER — MEDROXYPROGESTERONE ACETATE 150 MG/ML IM SUSP: 150.0000 mg | Freq: Once | INTRAMUSCULAR | Status: DC

## 2017-10-09 MED ORDER — IBUPROFEN 600 MG PO TABS: 600.0000 mg | ORAL_TABLET | Freq: Four times a day (QID) | ORAL | 0 refills | Status: DC

## 2017-10-09 NOTE — Discharge Instructions (Signed)
Vaginal Delivery, Care After °Refer to this sheet in the next few weeks. These instructions provide you with information about caring for yourself after vaginal delivery. Your health care provider may also give you more specific instructions. Your treatment has been planned according to current medical practices, but problems sometimes occur. Call your health care provider if you have any problems or questions. °What can I expect after the procedure? °After vaginal delivery, it is common to have: °· Some bleeding from your vagina. °· Soreness in your abdomen, your vagina, and the area of skin between your vaginal opening and your anus (perineum). °· Pelvic cramps. °· Fatigue. ° °Follow these instructions at home: °Medicines °· Take over-the-counter and prescription medicines only as told by your health care provider. °· If you were prescribed an antibiotic medicine, take it as told by your health care provider. Do not stop taking the antibiotic until it is finished. °Driving ° °· Do not drive or operate heavy machinery while taking prescription pain medicine. °· Do not drive for 24 hours if you received a sedative. °Lifestyle °· Do not drink alcohol. This is especially important if you are breastfeeding or taking medicine to relieve pain. °· Do not use tobacco products, including cigarettes, chewing tobacco, or e-cigarettes. If you need help quitting, ask your health care provider. °Eating and drinking °· Drink at least 8 eight-ounce glasses of water every day unless you are told not to by your health care provider. If you choose to breastfeed your baby, you may need to drink more water than this. °· Eat high-fiber foods every day. These foods may help prevent or relieve constipation. High-fiber foods include: °? Whole grain cereals and breads. °? Brown rice. °? Beans. °? Fresh fruits and vegetables. °Activity °· Return to your normal activities as told by your health care provider. Ask your health care provider  what activities are safe for you. °· Rest as much as possible. Try to rest or take a nap when your baby is sleeping. °· Do not lift anything that is heavier than your baby or 10 lb (4.5 kg) until your health care provider says that it is safe. °· Talk with your health care provider about when you can engage in sexual activity. This may depend on your: °? Risk of infection. °? Rate of healing. °? Comfort and desire to engage in sexual activity. °Vaginal Care °· If you have an episiotomy or a vaginal tear, check the area every day for signs of infection. Check for: °? More redness, swelling, or pain. °? More fluid or blood. °? Warmth. °? Pus or a bad smell. °· Do not use tampons or douches until your health care provider says this is safe. °· Watch for any blood clots that may pass from your vagina. These may look like clumps of dark red, brown, or black discharge. °General instructions °· Keep your perineum clean and dry as told by your health care provider. °· Wear loose, comfortable clothing. °· Wipe from front to back when you use the toilet. °· Ask your health care provider if you can shower or take a bath. If you had an episiotomy or a perineal tear during labor and delivery, your health care provider may tell you not to take baths for a certain length of time. °· Wear a bra that supports your breasts and fits you well. °· If possible, have someone help you with household activities and help care for your baby for at least a few days after   you leave the hospital. °· Keep all follow-up visits for you and your baby as told by your health care provider. This is important. °Contact a health care provider if: °· You have: °? Vaginal discharge that has a bad smell. °? Difficulty urinating. °? Pain when urinating. °? A sudden increase or decrease in the frequency of your bowel movements. °? More redness, swelling, or pain around your episiotomy or vaginal tear. °? More fluid or blood coming from your episiotomy or  vaginal tear. °? Pus or a bad smell coming from your episiotomy or vaginal tear. °? A fever. °? A rash. °? Little or no interest in activities you used to enjoy. °? Questions about caring for yourself or your baby. °· Your episiotomy or vaginal tear feels warm to the touch. °· Your episiotomy or vaginal tear is separating or does not appear to be healing. °· Your breasts are painful, hard, or turn red. °· You feel unusually sad or worried. °· You feel nauseous or you vomit. °· You pass large blood clots from your vagina. If you pass a blood clot from your vagina, save it to show to your health care provider. Do not flush blood clots down the toilet without having your health care provider look at them. °· You urinate more than usual. °· You are dizzy or light-headed. °· You have not breastfed at all and you have not had a menstrual period for 12 weeks after delivery. °· You have stopped breastfeeding and you have not had a menstrual period for 12 weeks after you stopped breastfeeding. °Get help right away if: °· You have: °? Pain that does not go away or does not get better with medicine. °? Chest pain. °? Difficulty breathing. °? Blurred vision or spots in your vision. °? Thoughts about hurting yourself or your baby. °· You develop pain in your abdomen or in one of your legs. °· You develop a severe headache. °· You faint. °· You bleed from your vagina so much that you fill two sanitary pads in one hour. °This information is not intended to replace advice given to you by your health care provider. Make sure you discuss any questions you have with your health care provider. °Document Released: 04/04/2000 Document Revised: 09/19/2015 Document Reviewed: 04/22/2015 °Elsevier Interactive Patient Education © 2018 Elsevier Inc. ° °

## 2017-10-09 NOTE — Discharge Summary (Signed)
OB Discharge Summary     Patient Name: Kimberly Macias DOB: 28-Jun-1980 MRN: [id number]  Date of admission: 10/07/2017 Delivering MD: Kimberly Macias   Date of discharge: 10/09/2017  Admitting diagnosis: LABOR Intrauterine pregnancy: [redacted]w[redacted]d     Secondary diagnosis:  Active Problems:   Indication for care in labor or delivery   Normal labor   Spontaneous vaginal delivery  Additional problems: None     Discharge diagnosis: Term Pregnancy Delivered                                                                                                Post partum procedures:none  Augmentation: None  Complications: None  Hospital course:  Onset of Labor With Vaginal Delivery     37 y.o. yo [id number] at [redacted]w[redacted]d was admitted in Active Labor on 10/07/2017. Patient had an uncomplicated labor course as follows:  Membrane Rupture Time/Date: 8:34 AM ,10/07/2017   Intrapartum Procedures: Episiotomy: None [1]                                         Lacerations:  None [1]  Patient had a delivery of a Viable infant. 10/07/2017  Information for the patient's newborn:  Kimberly Macias, Kimberly Macias [[id number]]  Delivery Method: Vaginal, Spontaneous(Filed from Delivery Summary)    Pateint had an uncomplicated postpartum course.  She is ambulating, tolerating a regular diet, passing flatus, and urinating well. Patient is discharged home in stable condition on 10/09/17.   Physical exam  Vitals:   10/07/17 2356 10/08/17 0651 10/08/17 1659 10/08/17 2300  BP: (!) 103/59 105/67 112/63 119/69  Pulse: 72 72 76 72  Resp: 18 18 16    Temp: 98.1 F (36.7 C) 98 F (36.7 C) 98.3 F (36.8 C) 98.4 F (36.9 C)  TempSrc: Oral Oral Oral Oral  SpO2:    100%  Weight:      Height:       General: alert, cooperative and no distress Lochia: appropriate Uterine Fundus: firm Incision: N/A DVT Evaluation: No evidence of DVT seen on physical exam. Labs: Lab Results  Component Value Date   WBC 10.3 10/07/2017   HGB 14.1  10/07/2017   HCT 41.2 10/07/2017   MCV 89.2 10/07/2017   PLT 202 10/07/2017   CMP Latest Ref Rng & Units 07/23/2017  Glucose 65 - 99 mg/dL 101(H)  BUN 6 - 20 mg/dL 6  Creatinine 0.44 - 1.00 mg/dL 0.36(L)  Sodium 135 - 145 mmol/L 133(L)  Potassium 3.5 - 5.1 mmol/L 3.5  Chloride 101 - 111 mmol/L 104  CO2 22 - 32 mmol/L 20(L)  Calcium 8.9 - 10.3 mg/dL 8.7(L)  Total Protein 6.5 - 8.1 g/dL 7.0  Total Bilirubin 0.3 - 1.2 mg/dL 1.0  Alkaline Phos 38 - 126 U/L 70  AST 15 - 41 U/L 19  ALT 14 - 54 U/L 13(L)    Discharge instruction: per After Visit Summary and "Baby and Me Booklet".  After visit meds:  Allergies as of 10/09/2017  No Known Allergies     Medication List    STOP taking these medications   nitrofurantoin (macrocrystal-monohydrate) 100 MG capsule Commonly known as:  MACROBID     TAKE these medications   ferrous sulfate 325 (65 FE) MG tablet Take 1 tablet (325 mg total) by mouth 2 (two) times daily with a meal.   ibuprofen 600 MG tablet Commonly known as:  ADVIL,MOTRIN Take 1 tablet (600 mg total) by mouth every 6 (six) hours.   Prenatal Adult Gummy/DHA/FA 0.4-25 MG Chew Chew 1 Piece by mouth daily.       Diet: routine diet  Activity: Advance as tolerated. Pelvic rest for 6 weeks.   Outpatient follow up:6 weeks Follow up Appt: Future Appointments  Date Time Provider [place]  11/11/2017  9:15 AM Kimberly Guild, NP [place]  11/11/2017 10:00 AM [place] [facility]   Follow up Visit:No follow-ups on file.  Postpartum contraception: Depo Provera  Newborn Data: Live born female  Birth Weight: 6 lb 0.7 oz (2741 g) APGAR: 8, 9  Newborn Delivery   Birth date/time:  10/07/2017 12:58:00 Delivery type:  Vaginal, Spontaneous     Baby Feeding: Bottle and Breast Disposition:home with mother   10/09/2017 Kimberly Hollow, MD  Patient seen and examined, agree with above note. Patient is PPD#2 from SVD. S/Macias Depo.  Stable for discharge.  Kimberly Macias 10/09/2017 3:32 PM

## 2017-10-13 ENCOUNTER — Encounter: Payer: Self-pay | Admitting: Student

## 2017-10-14 ENCOUNTER — Encounter: Payer: Self-pay | Admitting: Obstetrics & Gynecology

## 2017-10-14 ENCOUNTER — Ambulatory Visit (INDEPENDENT_AMBULATORY_CARE_PROVIDER_SITE_OTHER): Payer: Medicaid Other | Admitting: Obstetrics & Gynecology

## 2017-10-14 ENCOUNTER — Encounter: Payer: Self-pay | Admitting: Student

## 2017-10-14 VITALS — BP 119/82 | HR 72 | Resp 16 | Wt 141.0 lb

## 2017-10-14 DIAGNOSIS — N9089 Other specified noninflammatory disorders of vulva and perineum: Secondary | ICD-10-CM

## 2017-10-14 MED ORDER — OXYCODONE-ACETAMINOPHEN 5-325 MG PO TABS: 1.0000 | ORAL_TABLET | Freq: Four times a day (QID) | ORAL | 0 refills | Status: DC | PRN

## 2017-10-14 NOTE — Progress Notes (Signed)
   Subjective:    Patient ID: Kimberly Macias, female    DOB: [birth year], 37 y.o.   MRN: [id number]  HPI 37 yo P3 here for a check of her right labial hematoma. She reports that it is very painful but is slightly smaller.  Review of Systems     Objective:   Physical Exam Breathing, conversing, and ambulating normally Well nourished, well hydrated Black female, no apparent distress 6 cm right labial hematoma noted     Assessment & Plan:  Right labia hematoma - offered surgical drainage versus watchful waiting She opts for waiting #16 percocet given Postpartum exam in about 4 weeks

## 2017-10-21 ENCOUNTER — Telehealth: Payer: Self-pay | Admitting: General Practice

## 2017-10-21 NOTE — Telephone Encounter (Signed)
Sharhonda from [facility] called and left message on nurse voicemail line stating she performed a home visit on the patient this morning and her Flavia Shipper was 65, patient denies SI. Patient reports having good support at home & has counselors in the family. Patient has just been feeling tired & is trying to adjust. Arna Medici plans a follow up visit with the patient on 7/15.

## 2017-11-03 ENCOUNTER — Encounter: Payer: Self-pay | Admitting: Student

## 2017-11-10 NOTE — BH Specialist Note (Deleted)
Integrated Behavioral Health Follow Up Visit  MRN: [id number] Name: Kimberly Macias  Number of [place] Clinician visits: {IBH Number of Visits:21014052} Session Start time: ***  Session End time: *** Total time: {IBH Total Time:21014050}  Type of Service: [place] Interpretor:{yes [id number]} Interpretor Name and Language: ***  SUBJECTIVE: [name] is a 37 y.o. female accompanied by {Patient accompanied by:[phone]} Patient was referred by *** for ***. Patient reports the following symptoms/concerns: *** Duration of problem: ***; Severity of problem: {Mild/Moderate/Severe:20260}  OBJECTIVE: Mood: {BHH MOOD:22306} and Affect: {BHH AFFECT:22307} Risk of harm to self or others: {CHL AMB BH Suicide Current Mental Status:21022748}  LIFE CONTEXT: Family and Social: *** School/Work: *** Self-Care: *** Life Changes: ***  GOALS ADDRESSED: Patient will: 1.  Reduce symptoms of: {IBH Symptoms:21014056}  2.  Increase knowledge and/or ability of: {IBH Patient Tools:21014057}  3.  Demonstrate ability to: {IBH Goals:21014053}  INTERVENTIONS: Interventions utilized:  {IBH Interventions:21014054} Standardized Assessments completed: {IBH Screening Tools:21014051}  ASSESSMENT: Patient currently experiencing ***.   Patient may benefit from ***.  PLAN: 1. Follow up with behavioral health clinician on : *** 2. Behavioral recommendations: *** 3. Referral(s): {IBH Referrals:21014055} 4. "From scale of 1-10, how likely are you to follow plan?": ***  Garlan Fair, LCSW         Depression screen [facility] 2/9 10/14/2017 08/19/2017 06/15/2017 06/11/2017 04/22/2017  Decreased Interest 0 1 1 1 1   Down, Depressed, Hopeless 0 0 0 0 2  PHQ - 2 Score 0 1 1 1 3   Altered sleeping 2 1 1 1 1   Tired, decreased energy 2 1 1 1 1   Change in appetite 2 0 0 0 0  Feeling bad or failure about yourself  0 0 0 0 0  Trouble concentrating 0 0 0 0  0  Moving slowly or fidgety/restless 0 0 0 0 0  Suicidal thoughts 0 0 0 0 -  PHQ-9 Score 6 3 3 3 5   Difficult doing work/chores Not difficult at all - - - -   GAD 7 : Generalized Anxiety Score 10/14/2017 08/19/2017 06/15/2017 06/11/2017  Nervous, Anxious, on Edge 1 0 0 0  Control/stop worrying 1 1 0 0  Worry too much - different things 1 1 1 1   Trouble relaxing 1 0 0 0  Restless 0 0 0 0  Easily annoyed or irritable 1 1 1 1   Afraid - awful might happen 1 0 0 0  Total GAD 7 Score 6 3 2 2   Anxiety Difficulty Not difficult at all - - -

## 2017-11-11 ENCOUNTER — Ambulatory Visit: Payer: Self-pay | Admitting: Student

## 2017-11-15 ENCOUNTER — Encounter: Payer: Self-pay | Admitting: Family Medicine

## 2017-11-17 ENCOUNTER — Encounter: Payer: Self-pay | Admitting: Medical

## 2017-11-17 ENCOUNTER — Ambulatory Visit (INDEPENDENT_AMBULATORY_CARE_PROVIDER_SITE_OTHER): Payer: Medicaid Other | Admitting: Medical

## 2017-11-17 DIAGNOSIS — Z1389 Encounter for screening for other disorder: Secondary | ICD-10-CM

## 2017-11-17 DIAGNOSIS — Z3042 Encounter for surveillance of injectable contraceptive: Secondary | ICD-10-CM

## 2017-11-17 LAB — POCT PREGNANCY, URINE: PREG TEST UR: NEGATIVE

## 2017-11-17 NOTE — Patient Instructions (Signed)

## 2017-11-17 NOTE — Progress Notes (Signed)
Subjective:     Kimberly Macias is a 37 y.o. female who presents for a postpartum visit. She is 6 weeks postpartum following a spontaneous vaginal delivery. I have fully reviewed the prenatal and intrapartum course. The delivery was at 37.2 gestational weeks. Outcome: spontaneous vaginal delivery. Anesthesia: epidural. Postpartum course has been labial hematoma . Baby's course has been uncomplicated. Baby is feeding by bottle - Carnation Good Start Soy. Bleeding thin lochia. Bowel function is normal. Bladder function is normal. Patient is not sexually active. Contraception method is abstinence. Postpartum depression screening: negative.  The following portions of the patient's history were reviewed and updated as appropriate: allergies, current medications, past family history, past medical history, past social history, past surgical history and problem list.  Review of Systems Pertinent items are noted in HPI.   Objective:    BP 110/60   Pulse 69   Wt 140 lb (63.5 kg)   LMP 11/11/2017 (Approximate)   BMI 24.80 kg/m   General:  alert and cooperative   Breasts:  not performed  Lungs: clear to auscultation bilaterally  Heart:  regular rate and rhythm, S1, S2 normal, no murmur, click, rub or gallop  Abdomen: soft, non-tender; bowel sounds normal; no masses,  no organomegaly   Vulva:  minimal residual hematoma noted of the right labia  Vagina: normal vagina  Cervix:  not performed  Corpus: normal size, contour, position, consistency, mobility, non-tender  Adnexa:  normal adnexa and no mass, fullness, tenderness  Rectal Exam: Not performed.        Assessment:     Normal postpartum exam. Pap smear not done at today's visit.  Last pap 04/22/2017 was normal.  Labial hematoma   Plan:    1. Contraception: Depo-Provera injections 2. Recommended Calcium supplements due to intended long term use of Depo provera 3. Follow up in: 12 weeks for depo provera or sooner as needed.    Luvenia Redden, PA-C 11/17/2017 5:38 PM

## 2017-11-19 MED ORDER — MEDROXYPROGESTERONE ACETATE 150 MG/ML IM SUSP
150.0000 mg | Freq: Once | INTRAMUSCULAR | Status: AC
Start: 2017-11-17 — End: 2017-11-17
  Administered 2017-11-17: 150 mg via INTRAMUSCULAR

## 2017-11-19 NOTE — Addendum Note (Signed)
Addended by: Michel Harrow on: 11/19/2017 09:07 AM   Modules accepted: Orders

## 2018-02-15 ENCOUNTER — Encounter: Payer: Self-pay | Admitting: *Deleted

## 2018-02-17 ENCOUNTER — Ambulatory Visit (INDEPENDENT_AMBULATORY_CARE_PROVIDER_SITE_OTHER): Payer: Medicaid Other

## 2018-02-17 VITALS — BP 114/67 | HR 67 | Wt 138.4 lb

## 2018-02-17 DIAGNOSIS — Z3042 Encounter for surveillance of injectable contraceptive: Secondary | ICD-10-CM | POA: Diagnosis not present

## 2018-02-17 MED ORDER — MEDROXYPROGESTERONE ACETATE 150 MG/ML IM SUSP
150.0000 mg | Freq: Once | INTRAMUSCULAR | Status: AC
  Administered 2018-02-17: 150 mg via INTRAMUSCULAR

## 2018-02-17 NOTE — Progress Notes (Signed)
[name] here for Depo-Provera  Injection.  Injection administered without complication. Patient will return in 3 months for next injection.   Verdell Carmine, RN 02/17/2018  10:35 AM

## 2018-02-18 NOTE — Progress Notes (Signed)
I have reviewed this chart and agree with the RN/CMA assessment and management.    Ziara Thelander C Kainen Struckman, MD, FACOG Attending Physician, [facility]  

## 2018-05-04 ENCOUNTER — Telehealth: Payer: Self-pay

## 2018-05-04 ENCOUNTER — Ambulatory Visit: Payer: Self-pay | Admitting: Internal Medicine

## 2018-05-04 NOTE — Telephone Encounter (Signed)
Opened in Error.

## 2018-05-11 ENCOUNTER — Ambulatory Visit (INDEPENDENT_AMBULATORY_CARE_PROVIDER_SITE_OTHER): Payer: Medicaid Other | Admitting: Family Medicine

## 2018-05-11 VITALS — BP 100/66 | HR 76 | Ht 63.5 in | Wt 142.8 lb

## 2018-05-11 DIAGNOSIS — Z3042 Encounter for surveillance of injectable contraceptive: Secondary | ICD-10-CM

## 2018-05-11 DIAGNOSIS — N760 Acute vaginitis: Secondary | ICD-10-CM | POA: Diagnosis not present

## 2018-05-11 DIAGNOSIS — Z0000 Encounter for general adult medical examination without abnormal findings: Secondary | ICD-10-CM | POA: Diagnosis present

## 2018-05-11 DIAGNOSIS — Z01419 Encounter for gynecological examination (general) (routine) without abnormal findings: Secondary | ICD-10-CM

## 2018-05-11 DIAGNOSIS — B9689 Other specified bacterial agents as the cause of diseases classified elsewhere: Secondary | ICD-10-CM | POA: Diagnosis not present

## 2018-05-11 DIAGNOSIS — Z789 Other specified health status: Secondary | ICD-10-CM

## 2018-05-11 MED ORDER — FLUCONAZOLE 150 MG PO TABS: 150.0000 mg | ORAL_TABLET | Freq: Once | ORAL | 0 refills | Status: AC

## 2018-05-11 MED ORDER — METRONIDAZOLE 500 MG PO TABS: 500.0000 mg | ORAL_TABLET | Freq: Two times a day (BID) | ORAL | 0 refills | Status: DC

## 2018-05-11 NOTE — Progress Notes (Signed)
GYNECOLOGY OFFICE VISIT NOTE History:  38 y.o. H4T6546 here today for well-woman visit, depo injection. States she had a boil in her groin recently. Drained on own with warm compresses, now just occasionally itchy. Wanted to make sure healing normally. Soaked in tub for awhile with boil and now feels like might have BV. Discharge is yellow/white, smells funny and "just feels uncomfortable."   Likes depo, not interested in other birth control. Does not think she wants any future pregnancies. Not having periods at all with depo.   The following portions of the patient's history were reviewed and updated as appropriate: allergies, current medications, past family history, past medical history, past social history, past surgical history and problem list.   Health Maintenance:  Normal pap though transformation zone not seen on 04/22/17.   Review of Systems:  Pertinent items noted in HPI Review of Systems  Constitutional: Negative for chills, fever, malaise/fatigue and weight loss.  Respiratory: Negative for shortness of breath.   Cardiovascular: Negative for chest pain.  Gastrointestinal: Negative for abdominal pain, constipation, diarrhea, nausea and vomiting.  Genitourinary: Negative for dysuria and flank pain.  Musculoskeletal: Negative for back pain and myalgias.  Skin: Negative for rash.  Neurological: Negative for dizziness and headaches.  Psychiatric/Behavioral: Negative for depression. The patient is not nervous/anxious and does not have insomnia.    Objective:  Physical Exam BP 100/66   Pulse 76   Ht 5' 3.5" (1.613 m)   Wt 142 lb 12.8 oz (64.8 kg)   LMP  (LMP Unknown)   BMI 24.90 kg/m  Physical Exam  Constitutional: She is oriented to person, place, and time. She appears well-developed and well-nourished. No distress.  HENT:  Head: Normocephalic and atraumatic.  Eyes: Conjunctivae and EOM are normal. No scleral icterus.  Cardiovascular: Normal rate and regular rhythm.  No  murmur heard. Pulmonary/Chest: Effort normal and breath sounds normal. No respiratory distress. She has no wheezes. Right breast exhibits no mass, no nipple discharge, no skin change and no tenderness. Left breast exhibits no mass, no nipple discharge, no skin change and no tenderness. Breasts are symmetrical.  Abdominal: Soft. Bowel sounds are normal. She exhibits no distension. There is no abdominal tenderness.  Genitourinary:    Genitourinary Comments: Normal vulva  normal vaginal mucosa  copious white-yellow, thin discharge  normal appearing cervix without lesions  no CMT, normal-sized, anteverted uterus, no ovarian fullness or tenderness    Musculoskeletal:        General: No edema.  Neurological: She is alert and oriented to person, place, and time.  Skin: Skin is warm and dry. No rash noted.  Psychiatric: She has a normal mood and affect. Her behavior is normal.  Nursing note and vitals reviewed.  Labs and Imaging No results found for this or any previous visit (from the past 168 hour(s)). No results found.  Assessment & Plan:  Kimberly Macias is a 51PT [id number] who presents for well-woman visit.  Well-Woman Visit   Routine preventative health maintenance measures emphasized.  UTD on Pap smear  Declined STI testing  Depo injection given for contraception  Please refer to After Visit Summary for other counseling recommendations.   Bacterial Vaginosis: Clinical diagnosis based on appearance and symptoms. Will treat with metronidazole and diflucan given for prn yeast if develops yeast infection. Counseling on vaginal hygiene discussed.   Return if symptoms worsen or fail to improve.  Total face-to-face time with patient: 25 minutes.  Over 50% of encounter was spent on counseling and  coordination of care.  Lambert Mody. Juleen China, DO OB Family Medicine Fellow, [facility] for [employer], [place]

## 2018-05-11 NOTE — Progress Notes (Signed)
Here today for annual exam so she can get her depo-provera .Would like to be checked and std tests if needed because she had a boil in her groin area that went away after she used warm compresses and baths.

## 2018-05-11 NOTE — Patient Instructions (Signed)

## 2018-05-12 MED ORDER — MEDROXYPROGESTERONE ACETATE 150 MG/ML IM SUSP
150.0000 mg | Freq: Once | INTRAMUSCULAR | Status: AC
  Administered 2018-05-11: 150 mg via INTRAMUSCULAR

## 2018-05-12 NOTE — Addendum Note (Signed)
Addended by: Michel Harrow on: 05/12/2018 11:52 AM   Modules accepted: Orders

## 2018-07-28 ENCOUNTER — Ambulatory Visit: Payer: Self-pay

## 2018-07-29 ENCOUNTER — Telehealth: Payer: Self-pay | Admitting: Family Medicine

## 2018-07-29 NOTE — Telephone Encounter (Signed)
Attempted to call patient with the new address to the office. No answer, and the number just rang busy so could not leave a message.

## 2018-08-02 ENCOUNTER — Ambulatory Visit: Payer: Self-pay

## 2018-08-09 ENCOUNTER — Telehealth: Payer: Self-pay | Admitting: Family Medicine

## 2018-08-09 ENCOUNTER — Ambulatory Visit: Payer: Self-pay

## 2018-08-09 ENCOUNTER — Ambulatory Visit (INDEPENDENT_AMBULATORY_CARE_PROVIDER_SITE_OTHER): Payer: Medicaid Other | Admitting: *Deleted

## 2018-08-09 ENCOUNTER — Other Ambulatory Visit: Payer: Self-pay

## 2018-08-09 ENCOUNTER — Inpatient Hospital Stay (HOSPITAL_COMMUNITY)
Admission: AD | Admit: 2018-08-09 | Discharge: 2018-08-09 | Disposition: A | Discharge status: Other Acute Inpatient Hospital | Payer: Medicaid Other | Attending: Obstetrics and Gynecology | Admitting: Obstetrics and Gynecology

## 2018-08-09 DIAGNOSIS — Z3042 Encounter for surveillance of injectable contraceptive: Secondary | ICD-10-CM

## 2018-08-09 DIAGNOSIS — Z789 Other specified health status: Secondary | ICD-10-CM

## 2018-08-09 MED ORDER — MEDROXYPROGESTERONE ACETATE 150 MG/ML IM SUSP
150.0000 mg | Freq: Once | INTRAMUSCULAR | Status: AC
  Administered 2018-08-09: 16:00:00 150 mg via INTRAMUSCULAR

## 2018-08-09 NOTE — Progress Notes (Signed)
Agree with A & P. 

## 2018-08-09 NOTE — Progress Notes (Signed)
Pt here for depo injection 33mos after prior Depo. No concerns voiced.

## 2018-08-09 NOTE — Telephone Encounter (Signed)
Attempted to contact patient with her next depo injection appointment ( 7/6 @ 3:00pm). No answer, left appointment information on voicemail and to give the office a call back if needing to reschedule.

## 2018-09-17 ENCOUNTER — Encounter: Payer: Self-pay | Admitting: *Deleted

## 2018-10-21 ENCOUNTER — Telehealth: Payer: Self-pay | Admitting: Obstetrics and Gynecology

## 2018-10-21 NOTE — Telephone Encounter (Signed)
Called the patient to pre-screen. Left a detailed voicemail informing if the patient is experiencing any flu-like symptoms such as fever, chills, cough, or shortness of breath and/or has been in contact with anyone who is suspected of/or confirmed to have COVID19 please call back to reschedule the appointment. Upon entering the office please wear a face mask, sanitize hands, and no visitors or children due to COVID19 restrictions. °

## 2018-10-25 ENCOUNTER — Ambulatory Visit (INDEPENDENT_AMBULATORY_CARE_PROVIDER_SITE_OTHER): Payer: Medicaid Other

## 2018-10-25 ENCOUNTER — Other Ambulatory Visit: Payer: Self-pay

## 2018-10-25 DIAGNOSIS — Z3042 Encounter for surveillance of injectable contraceptive: Secondary | ICD-10-CM

## 2018-10-25 MED ORDER — MEDROXYPROGESTERONE ACETATE 150 MG/ML IM SUSP
150.0000 mg | Freq: Once | INTRAMUSCULAR | Status: AC
  Administered 2018-10-25: 16:00:00 150 mg via INTRAMUSCULAR

## 2018-10-25 NOTE — Progress Notes (Signed)
[name] here for Depo-Provera  Injection.  Injection administered without complication. Patient will return in 3 months for next injection.  Verdell Carmine, RN 10/25/2018  3:44 PM

## 2018-11-03 ENCOUNTER — Other Ambulatory Visit: Payer: Self-pay | Admitting: *Deleted

## 2018-11-03 DIAGNOSIS — Z20822 Contact with and (suspected) exposure to covid-19: Secondary | ICD-10-CM

## 2018-11-07 LAB — NOVEL CORONAVIRUS, NAA: SARS-CoV-2, NAA: NOT DETECTED

## 2018-11-11 NOTE — Progress Notes (Signed)
Patient ID: Kimberly Macias, female   DOB: [birth year], 38 y.o.   MRN: [id number] Patient seen and assessed by nursing staff during this encounter. I have reviewed the chart and agree with the documentation and plan.  Emeterio Reeve, MD 11/11/2018 11:08 AM

## 2019-01-10 ENCOUNTER — Ambulatory Visit: Payer: Medicaid Other

## 2019-01-12 ENCOUNTER — Ambulatory Visit: Payer: Medicaid Other

## 2019-01-14 ENCOUNTER — Other Ambulatory Visit: Payer: Self-pay

## 2019-01-14 DIAGNOSIS — Z20822 Contact with and (suspected) exposure to covid-19: Secondary | ICD-10-CM

## 2019-01-15 LAB — NOVEL CORONAVIRUS, NAA: SARS-CoV-2, NAA: NOT DETECTED

## 2019-01-28 ENCOUNTER — Other Ambulatory Visit: Payer: Self-pay

## 2019-01-28 ENCOUNTER — Ambulatory Visit (INDEPENDENT_AMBULATORY_CARE_PROVIDER_SITE_OTHER): Payer: Medicaid Other | Admitting: Lactation Services

## 2019-01-28 DIAGNOSIS — Z3042 Encounter for surveillance of injectable contraceptive: Secondary | ICD-10-CM | POA: Diagnosis present

## 2019-01-28 MED ORDER — MEDROXYPROGESTERONE ACETATE 150 MG/ML IM SUSP
150.0000 mg | Freq: Once | INTRAMUSCULAR | Status: AC
  Administered 2019-01-28: 150 mg via INTRAMUSCULAR

## 2019-01-28 NOTE — Progress Notes (Signed)
[name] here for Depo-Provera  Injection.  Injection administered without complication. Patient will return in 3 months for next injection. Pt tolerated well.   Pt 13 weeks 4 days since last injection. UPT negative.   Donn Pierini, RN 01/28/2019  11:07 AM

## 2019-02-08 ENCOUNTER — Other Ambulatory Visit: Payer: Self-pay

## 2019-02-08 DIAGNOSIS — Z20822 Contact with and (suspected) exposure to covid-19: Secondary | ICD-10-CM

## 2019-02-09 LAB — NOVEL CORONAVIRUS, NAA: SARS-CoV-2, NAA: NOT DETECTED

## 2019-04-19 ENCOUNTER — Ambulatory Visit: Payer: Medicaid Other

## 2019-04-19 ENCOUNTER — Ambulatory Visit: Payer: Medicaid Other | Attending: Internal Medicine

## 2019-04-19 DIAGNOSIS — Z20822 Contact with and (suspected) exposure to covid-19: Secondary | ICD-10-CM

## 2019-04-20 LAB — NOVEL CORONAVIRUS, NAA: SARS-CoV-2, NAA: NOT DETECTED

## 2019-05-09 ENCOUNTER — Ambulatory Visit (INDEPENDENT_AMBULATORY_CARE_PROVIDER_SITE_OTHER): Payer: Medicaid Other | Admitting: *Deleted

## 2019-05-09 ENCOUNTER — Other Ambulatory Visit: Payer: Self-pay

## 2019-05-09 DIAGNOSIS — Z3042 Encounter for surveillance of injectable contraceptive: Secondary | ICD-10-CM | POA: Diagnosis not present

## 2019-05-09 LAB — POCT PREGNANCY, URINE
Preg Test, Ur: NEGATIVE
Preg Test, Ur: NEGATIVE

## 2019-05-09 MED ORDER — MEDROXYPROGESTERONE ACETATE 150 MG/ML IM SUSP
150.0000 mg | Freq: Once | INTRAMUSCULAR | Status: AC
  Administered 2019-05-09: 14:00:00 150 mg via INTRAMUSCULAR

## 2019-05-09 NOTE — Progress Notes (Addendum)
[name] here for Depo-Provera  Injection.  Injection administered without complication. Patient will return in 3 months for next injection.  Pt is 10 days late from last Depo Provera injection.  Pregnancy test resulted negative today  Verdell Carmine, RN 05/09/2019  2:00 PM  Chart reviewed for nurse visit. Agree with plan of care.   Virginia Rochester, NP 05/09/2019 2:40 PM

## 2019-05-16 ENCOUNTER — Ambulatory Visit: Payer: Medicaid Other

## 2019-07-14 ENCOUNTER — Encounter: Payer: Self-pay | Admitting: *Deleted

## 2019-07-14 ENCOUNTER — Other Ambulatory Visit: Payer: Self-pay

## 2019-07-14 ENCOUNTER — Ambulatory Visit (INDEPENDENT_AMBULATORY_CARE_PROVIDER_SITE_OTHER): Payer: Medicaid Other | Admitting: *Deleted

## 2019-07-14 VITALS — BP 104/65 | HR 66 | Ht 64.5 in | Wt 144.5 lb

## 2019-07-14 DIAGNOSIS — R350 Frequency of micturition: Secondary | ICD-10-CM | POA: Diagnosis present

## 2019-07-14 DIAGNOSIS — M545 Low back pain, unspecified: Secondary | ICD-10-CM

## 2019-07-14 DIAGNOSIS — Z3042 Encounter for surveillance of injectable contraceptive: Secondary | ICD-10-CM

## 2019-07-14 LAB — POCT URINALYSIS DIP (DEVICE)
Bilirubin Urine: NEGATIVE
Glucose, UA: NEGATIVE mg/dL
Ketones, ur: NEGATIVE mg/dL
Nitrite: POSITIVE — AB
Protein, ur: NEGATIVE mg/dL
Specific Gravity, Urine: 1.025 (ref 1.005–1.030)
Urobilinogen, UA: 0.2 mg/dL (ref 0.0–1.0)
pH: 5.5 (ref 5.0–8.0)

## 2019-07-14 MED ORDER — FLUCONAZOLE 150 MG PO TABS: 150.0000 mg | ORAL_TABLET | Freq: Once | ORAL | 0 refills | Status: AC

## 2019-07-14 MED ORDER — NITROFURANTOIN MONOHYD MACRO 100 MG PO CAPS: ORAL_CAPSULE | ORAL | 0 refills | Status: DC

## 2019-07-14 MED ORDER — MEDROXYPROGESTERONE ACETATE 150 MG/ML IM SUSP
150.0000 mg | Freq: Once | INTRAMUSCULAR | Status: AC
  Administered 2019-07-14: 150 mg via INTRAMUSCULAR

## 2019-07-14 NOTE — Progress Notes (Signed)
Pt reports having sharp, stabbing pain in pelvis and back pain which began 5 days ago. She has had UTI's in the past with these same sx. Pt denies painful urination however does have frequency of urination. Urinalysis was indicative of UTI with Hgb - small, Nitrite-positive, leukocytes- small.  Macrobid Rx sent to pharmacy per standing order. Pt reports she gets yeast infection following antibiotics - fluconazole Rx also sent per standing order. Pt states she is scheduled for next Depo Provera injection on 4/7 and requests to receive dose today if possible. Per consult w/Julie Hillard Danker, pt's request was approved. Depo Provera 150 mg IM administered and pt tolerated well. Next dose due 6/10-6/24. Pt will need Annual Gyn exam at the same time as next Depo.

## 2019-07-16 LAB — URINE CULTURE

## 2019-07-16 NOTE — Progress Notes (Signed)
Chart reviewed for nurse visit. Agree with plan of care.   Lezlie Lye, NP 07/16/2019 2:28 PM

## 2019-07-27 ENCOUNTER — Ambulatory Visit: Payer: Medicaid Other

## 2019-08-30 ENCOUNTER — Ambulatory Visit (HOSPITAL_COMMUNITY)
Admission: EM | Admit: 2019-08-30 | Discharge: 2019-08-30 | Disposition: A | Discharge status: Home / Self Care | Payer: Medicaid Other | Attending: Family Medicine | Admitting: Family Medicine

## 2019-08-30 ENCOUNTER — Encounter (HOSPITAL_COMMUNITY): Payer: Self-pay

## 2019-08-30 ENCOUNTER — Other Ambulatory Visit: Payer: Self-pay

## 2019-08-30 DIAGNOSIS — M545 Low back pain, unspecified: Secondary | ICD-10-CM

## 2019-08-30 DIAGNOSIS — R109 Unspecified abdominal pain: Secondary | ICD-10-CM

## 2019-08-30 DIAGNOSIS — R350 Frequency of micturition: Secondary | ICD-10-CM

## 2019-08-30 LAB — POCT URINALYSIS DIP (DEVICE)
Bilirubin Urine: NEGATIVE
Glucose, UA: NEGATIVE mg/dL
Ketones, ur: NEGATIVE mg/dL
Leukocytes,Ua: NEGATIVE
Nitrite: NEGATIVE
Protein, ur: NEGATIVE mg/dL
Specific Gravity, Urine: >=1.03 (ref 1.005–1.030)
Urobilinogen, UA: 0.2 mg/dL (ref 0.0–1.0)
pH: 5 (ref 5.0–8.0)

## 2019-08-30 LAB — POC URINE PREG, ED: Preg Test, Ur: NEGATIVE

## 2019-08-30 MED ORDER — TRAMADOL HCL 50 MG PO TABS: 50.0000 mg | ORAL_TABLET | Freq: Two times a day (BID) | ORAL | 0 refills | Status: AC | PRN

## 2019-08-30 MED ORDER — KETOROLAC TROMETHAMINE 60 MG/2ML IM SOLN
60.0000 mg | Freq: Once | INTRAMUSCULAR | Status: AC
  Administered 2019-08-30: 60 mg via INTRAMUSCULAR

## 2019-08-30 MED ORDER — IBUPROFEN 800 MG PO TABS
800.0000 mg | ORAL_TABLET | Freq: Three times a day (TID) | ORAL | 0 refills | Status: DC
Start: 2019-08-30 — End: 2019-10-19

## 2019-08-30 MED ORDER — ONDANSETRON 4 MG PO TBDP
4.0000 mg | ORAL_TABLET | Freq: Once | ORAL | Status: AC
  Administered 2019-08-30: 16:00:00 4 mg via ORAL

## 2019-08-30 MED ORDER — ONDANSETRON 4 MG PO TBDP
ORAL_TABLET | ORAL | Status: AC
  Filled 2019-08-30: qty 1

## 2019-08-30 MED ORDER — KETOROLAC TROMETHAMINE 60 MG/2ML IM SOLN
INTRAMUSCULAR | Status: AC
  Filled 2019-08-30: qty 2

## 2019-08-30 MED ORDER — ONDANSETRON 4 MG PO TBDP
4.0000 mg | ORAL_TABLET | Freq: Three times a day (TID) | ORAL | 0 refills | Status: DC | PRN
Start: 2019-08-30 — End: 2019-10-19

## 2019-08-30 NOTE — Discharge Instructions (Signed)
I believe that you most likely have a kidney stone. We have given you some Toradol here for pain and Zofran for nausea. Sending some 800 mg ibuprofen to use every 8 hours as needed for pain along with Zofran for nausea, vomiting Make sure drinking plenty of water and staying hydrated Tramadol as needed for more severe pain If your symptoms worsen or do not improve you will need to go to the ER. I have given you contact for primary care follow-up and they can send you to a specialist as needed for your other symptoms.

## 2019-08-30 NOTE — ED Triage Notes (Signed)
Pt c/o right flank pain x2 days, onset nausea today. Pt states she was dx with UTI approx 1 month ago and was given abx Rx, pt states she did not finish that prescription, but started taking it again yesterday when flank pain began. Denies fever, but c/o chills. Denies any recent dose of anti-pyretic or pain reliever.

## 2019-08-31 NOTE — ED Provider Notes (Addendum)
[place]    CSN: [id number] Arrival date & time: 08/30/19  1418      History   Chief Complaint Chief Complaint  Patient presents with  . Flank Pain    HPI Kimberly Macias is a 39 y.o. female.   Patient is a 39 year old female who presents today with acute onset right flank pain x2 days and nausea.  Symptoms have been constant, waxing waning.  Recently diagnosed with UTI approximately 1 month ago and given prescription for Macrobid which she did not complete.  Started taking the medication again yesterday.  Denies any associated dysuria, hematuria urinary frequency.  Denies any fever, chills, vomiting.  Has not taken anything for the symptoms.  No known history of kidney stones but admits to not drinking much water and drinking mostly sodas and teas.  ROS per HPI      Past Medical History:  Diagnosis Date  . Anemia   . Gestational diabetes 2010   giet controlled    Patient Active Problem List   Diagnosis Date Noted  . Syncope 07/07/2017    Past Surgical History:  Procedure Laterality Date  . DIAGNOSTIC LAPAROSCOPY WITH REMOVAL OF ECTOPIC PREGNANCY N/A 07/05/2016   Procedure: DIAGNOSTIC LAPAROSCOPY WITH REMOVAL OF ECTOPIC PREGNANCY;  Surgeon: [name], MD;  Location: [facility];  Service: Gynecology;  Laterality: N/A;  . LAPAROSCOPIC UNILATERAL SALPINGECTOMY Left 07/05/2016   Procedure: LAPAROSCOPIC UNILATERAL SALPINGECTOMY;  Surgeon: [name], MD;  Location: [facility];  Service: Gynecology;  Laterality: Left;    OB History    Gravida  4   Para  2   Term  2   Preterm  0   AB  2   Living  2     SAB  0   TAB  1   Ectopic  1   Multiple  0   Live Births  2            Home Medications    Prior to Admission medications   Medication Sig Start Date End Date Taking? Authorizing Provider  nitrofurantoin, macrocrystal-monohydrate, (MACROBID) 100 MG capsule Take 1 capsule 2 times daily for 5 days 07/14/19  Yes Rasch, Anderson Malta I, NP    ibuprofen (ADVIL) 800 MG tablet Take 1 tablet (800 mg total) by mouth 3 (three) times daily. 08/30/19   Loura Halt A, NP  ondansetron (ZOFRAN ODT) 4 MG disintegrating tablet Take 1 tablet (4 mg total) by mouth every 8 (eight) hours as needed for nausea or vomiting. 08/30/19   Loura Halt A, NP  traMADol (ULTRAM) 50 MG tablet Take 1 tablet (50 mg total) by mouth every 12 (twelve) hours as needed for up to 3 days. 08/30/19 09/02/19  Orvan July, NP    Family History History reviewed. No pertinent family history.  Social History Social History   Tobacco Use  . Smoking status: Never Smoker  . Smokeless tobacco: Never Used  Substance Use Topics  . Alcohol use: Yes    Comment: occasional  . Drug use: No     Allergies   Patient has no known allergies.   Review of Systems Review of Systems   Physical Exam Triage Vital Signs ED Triage Vitals  Enc Vitals Group     BP 08/30/19 1445 110/70     Pulse Rate 08/30/19 1445 75     Resp 08/30/19 1445 16     Temp 08/30/19 1445 98.4 F (36.9 C)     Temp Source 08/30/19 1445  Oral     SpO2 08/30/19 1445 100 %     Weight --      Height --      Head Circumference --      Peak Flow --      Pain Score 08/30/19 1443 8     Pain Loc --      Pain Edu? --      Excl. in [place]? --    No data found.  Updated Vital Signs BP 110/70 (BP Location: Right Arm)   Pulse 75   Temp 98.4 F (36.9 C) (Oral)   Resp 16   SpO2 100%   Visual Acuity Right Eye Distance:   Left Eye Distance:   Bilateral Distance:    Right Eye Near:   Left Eye Near:    Bilateral Near:     Physical Exam Vitals and nursing note reviewed.  Constitutional:      General: She is not in acute distress.    Appearance: Normal appearance. She is not ill-appearing, toxic-appearing or diaphoretic.  HENT:     Head: Normocephalic.     Nose: Nose normal.     Mouth/Throat:     Pharynx: Oropharynx is clear.  Eyes:     Conjunctiva/sclera: Conjunctivae normal.  Pulmonary:      Effort: Pulmonary effort is normal.  Abdominal:     Palpations: Abdomen is soft.     Tenderness: There is no abdominal tenderness. There is right CVA tenderness. There is no left CVA tenderness.  Musculoskeletal:        General: Normal range of motion.     Cervical back: Normal range of motion.  Skin:    General: Skin is warm and dry.     Findings: No rash.  Neurological:     Mental Status: She is alert.  Psychiatric:        Mood and Affect: Mood normal.      UC Treatments / Results  Labs (all labs ordered are listed, but only abnormal results are displayed) Labs Reviewed  POCT URINALYSIS DIP (DEVICE) - Abnormal; Notable for the following components:      Result Value   Hgb urine dipstick TRACE (*)    All other components within normal limits  POC URINE PREG, ED    EKG   Radiology No results found.  Procedures Procedures (including critical care time)  Medications Ordered in UC Medications  ketorolac (TORADOL) injection 60 mg (60 mg Intramuscular Given 08/30/19 1541)  ondansetron (ZOFRAN-ODT) disintegrating tablet 4 mg (4 mg Oral Given 08/30/19 1541)    Initial Impression / Assessment and Plan / UC Course  I have reviewed the triage vital signs and the nursing notes.  Pertinent labs & imaging results that were available during my care of the patient were reviewed by me and considered in my medical decision making (see chart for details).     Right flank pain Trace blood in urine without signs of nitrates or leukocytes.  No concern for pyelonephritis at this time. Most likely patient's pain is associated with kidney stone Toradol and Zofran given here for symptoms Recommended push fluids to include water and decrease sodas and teas. Sending home with 800mg  ibuprofen and Zofran as needed for nausea, vomiting. Tramadol as needed for severe pain Recommended that if symptoms continue or worsen she will need to go to the ER for CT scan Contact given for primary care  follow-up Final Clinical Impressions(s) / UC Diagnoses   Final diagnoses:  Flank pain  Discharge Instructions     I believe that you most likely have a kidney stone. We have given you some Toradol here for pain and Zofran for nausea. Sending some 800 mg ibuprofen to use every 8 hours as needed for pain along with Zofran for nausea, vomiting Make sure drinking plenty of water and staying hydrated Tramadol as needed for more severe pain If your symptoms worsen or do not improve you will need to go to the ER. I have given you contact for primary care follow-up and they can send you to a specialist as needed for your other symptoms.    ED Prescriptions    Medication Sig Dispense Auth. Provider   ibuprofen (ADVIL) 800 MG tablet Take 1 tablet (800 mg total) by mouth 3 (three) times daily. 21 tablet Kiam Bransfield A, NP   traMADol (ULTRAM) 50 MG tablet Take 1 tablet (50 mg total) by mouth every 12 (twelve) hours as needed for up to 3 days. 6 tablet Avayah Raffety A, NP   ondansetron (ZOFRAN ODT) 4 MG disintegrating tablet Take 1 tablet (4 mg total) by mouth every 8 (eight) hours as needed for nausea or vomiting. 20 tablet Salayah Meares A, NP     I have reviewed the PDMP during this encounter.   Orvan July, NP 08/31/19 0841    Orvan July, NP 08/31/19 [phone]

## 2019-09-11 IMAGING — US US OB TRANSVAGINAL
1 series · 15 of 28 positions shown · non-contrast
Comparison: Pelvic ultrasound 02/26/2017

CLINICAL DATA: Pregnant patient with abdominal pain.

EXAM:
TRANSVAGINAL OB ULTRASOUND
TECHNIQUE: Transvaginal ultrasound was performed for complete evaluation of the
gestation as well as the maternal uterus, adnexal regions, and
pelvic cul-de-sac.

[Series 1: us ob transvaginal · 15 of 42 slices shown]
[im 1/42]
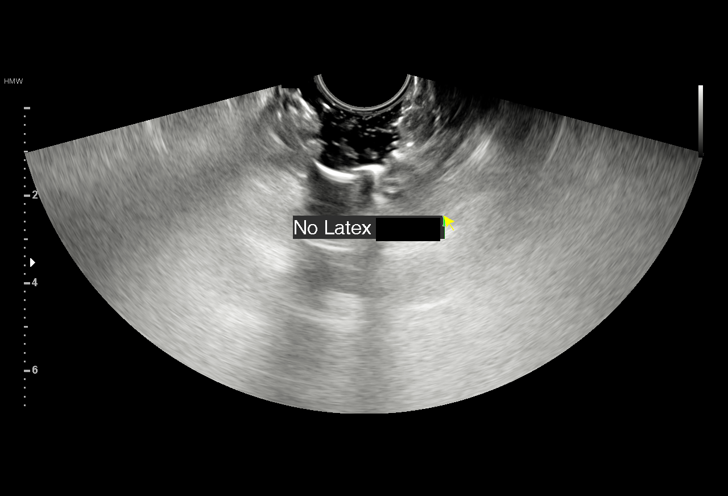
[im 4/42]
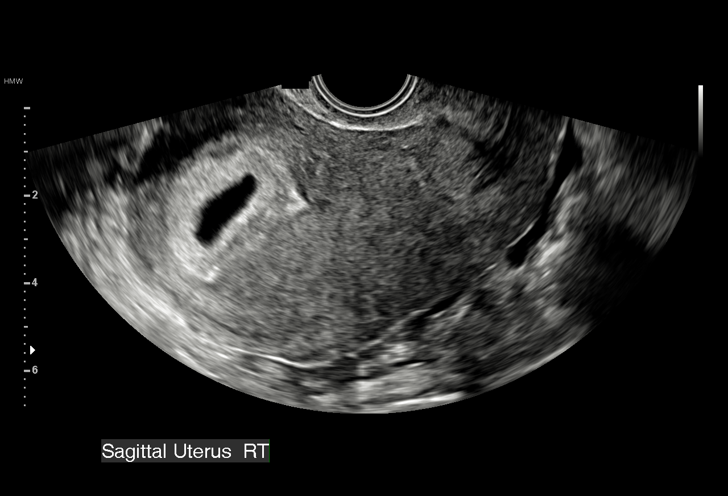
[im 7/42]
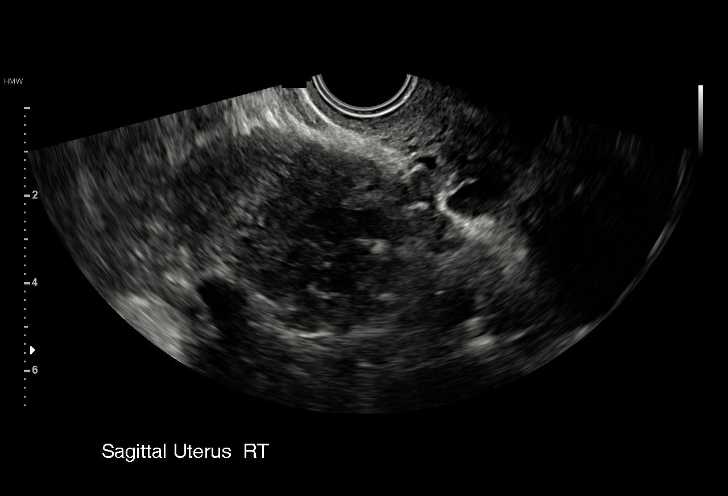
[im 10/42]
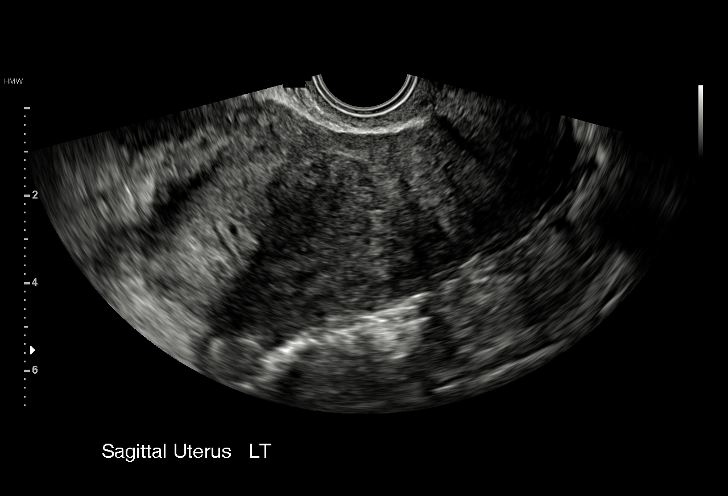
[im 13/42]
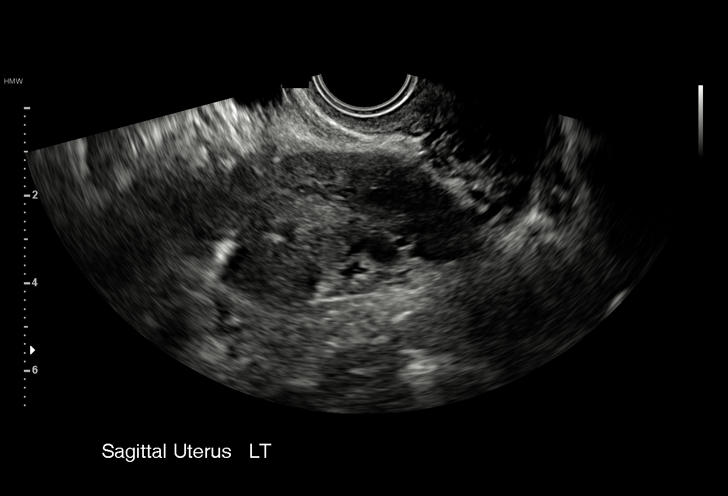
[im 16/42]
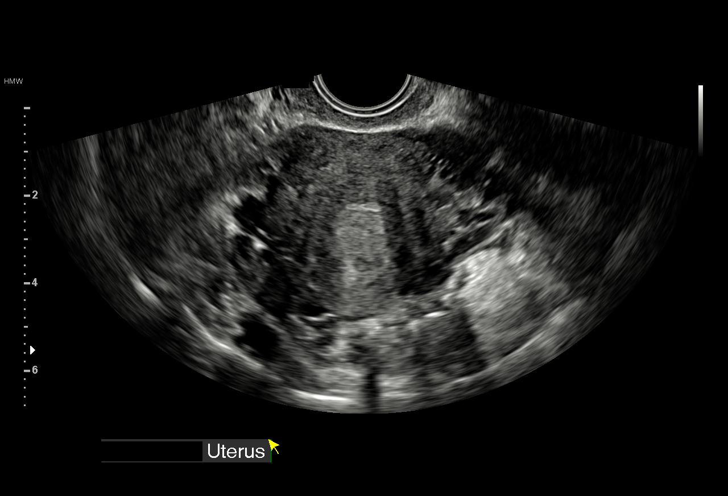
[im 19/42]
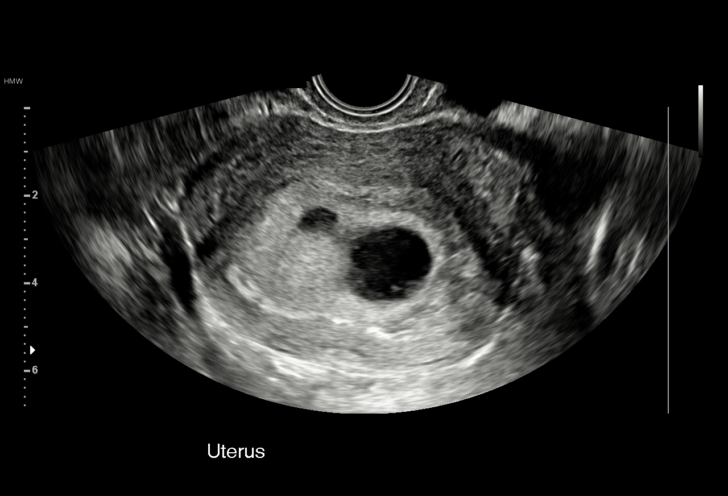
[im 22/42]
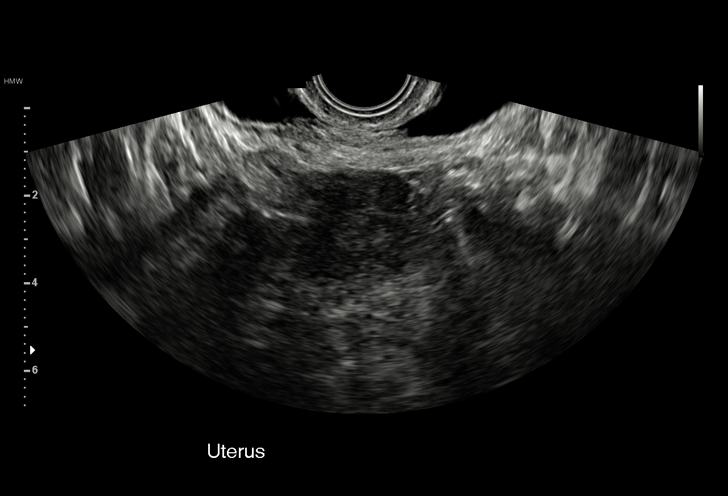
[im 23/42]
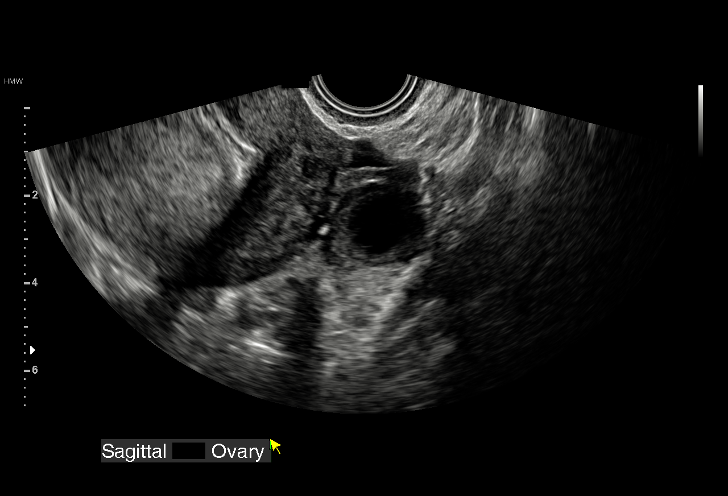
[im 26/42]
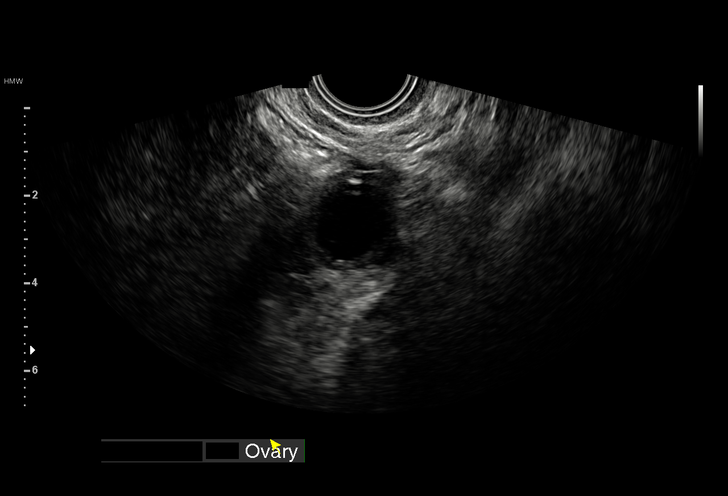
[im 29/42]
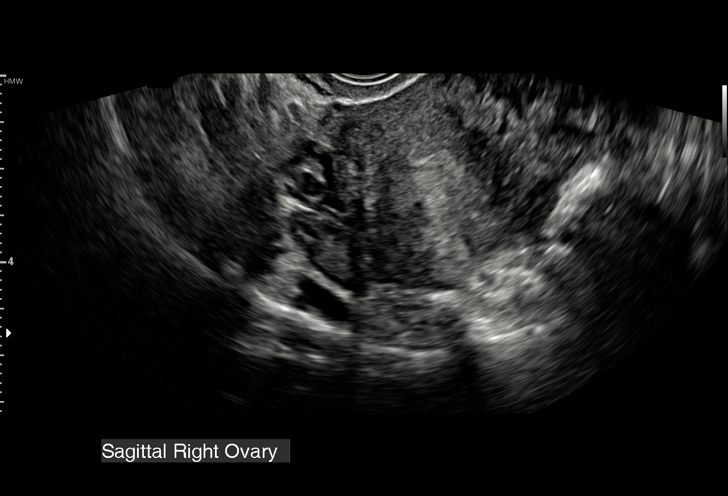
[im 32/42]
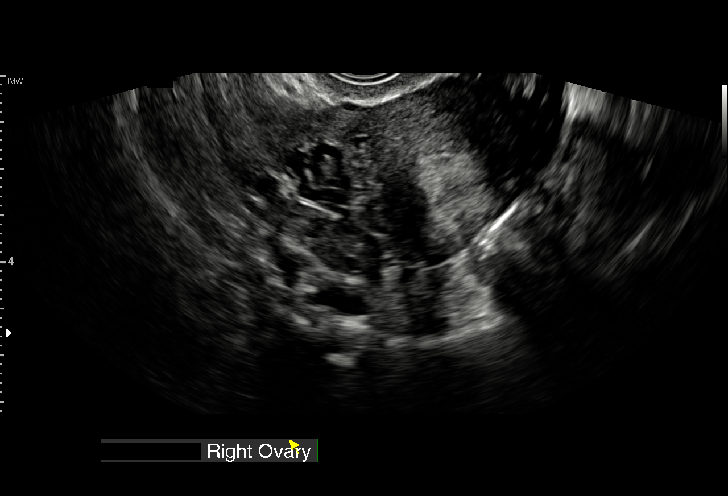
[im 35/42]
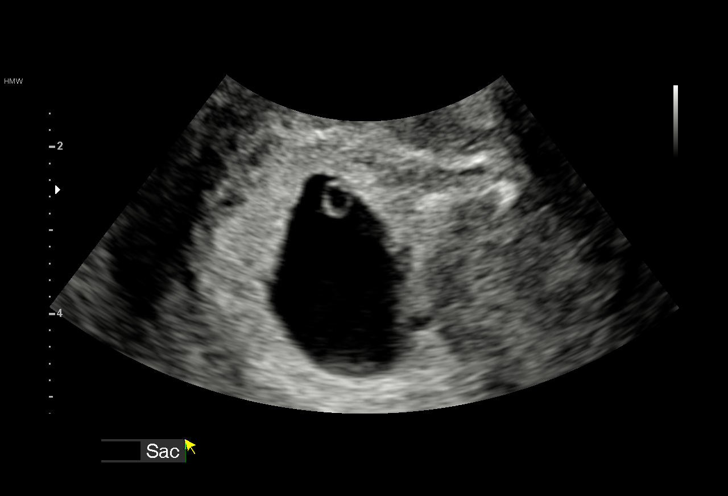
[im 38/42]
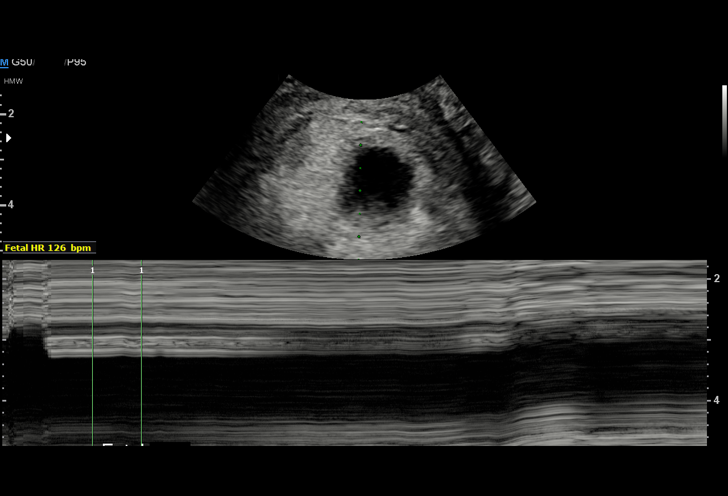
[im 42/42]
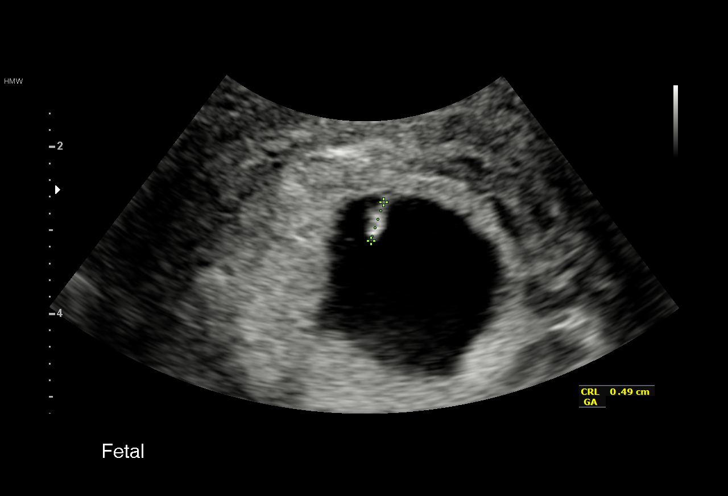

[15 of 28 positions shown; findings below may reference images not displayed]

FINDINGS: Intrauterine gestational sac: Single

Yolk sac:  Visualized.

Embryo:  Visualized.

Cardiac Activity: Visualized.

Heart Rate: 126 bpm

CRL:   4.8  mm   6 w 1 d                  US EDC: 10/28/2017

Subchorionic hemorrhage:  None visualized.

Maternal uterus/adnexae: Normal right and left ovaries. Corpus
luteum left ovary. Trace pelvic free fluid.
IMPRESSION: Single live intrauterine gestation.  No subchorionic hemorrhage.

## 2019-09-29 ENCOUNTER — Encounter: Payer: Self-pay | Admitting: Obstetrics and Gynecology

## 2019-09-29 ENCOUNTER — Ambulatory Visit (INDEPENDENT_AMBULATORY_CARE_PROVIDER_SITE_OTHER): Payer: Medicaid Other | Admitting: Obstetrics and Gynecology

## 2019-09-29 ENCOUNTER — Ambulatory Visit (INDEPENDENT_AMBULATORY_CARE_PROVIDER_SITE_OTHER): Payer: Medicaid Other | Admitting: *Deleted

## 2019-09-29 ENCOUNTER — Other Ambulatory Visit: Payer: Self-pay

## 2019-09-29 VITALS — BP 117/81 | HR 64 | Ht 63.5 in | Wt 142.2 lb

## 2019-09-29 VITALS — BP 114/81 | HR 64

## 2019-09-29 DIAGNOSIS — D229 Melanocytic nevi, unspecified: Secondary | ICD-10-CM | POA: Insufficient documentation

## 2019-09-29 DIAGNOSIS — Z01419 Encounter for gynecological examination (general) (routine) without abnormal findings: Secondary | ICD-10-CM | POA: Insufficient documentation

## 2019-09-29 DIAGNOSIS — Z0000 Encounter for general adult medical examination without abnormal findings: Secondary | ICD-10-CM

## 2019-09-29 DIAGNOSIS — R8290 Unspecified abnormal findings in urine: Secondary | ICD-10-CM | POA: Diagnosis not present

## 2019-09-29 DIAGNOSIS — Z3042 Encounter for surveillance of injectable contraceptive: Secondary | ICD-10-CM | POA: Diagnosis not present

## 2019-09-29 LAB — POCT URINALYSIS DIP (DEVICE)
Bilirubin Urine: NEGATIVE
Glucose, UA: NEGATIVE mg/dL
Ketones, ur: NEGATIVE mg/dL
Nitrite: POSITIVE — AB
Protein, ur: NEGATIVE mg/dL
Specific Gravity, Urine: >=1.03 (ref 1.005–1.030)
Urobilinogen, UA: 0.2 mg/dL (ref 0.0–1.0)
pH: 5.5 (ref 5.0–8.0)

## 2019-09-29 MED ORDER — MEDROXYPROGESTERONE ACETATE 150 MG/ML IM SUSP
150.0000 mg | Freq: Once | INTRAMUSCULAR | Status: AC
Start: 2019-09-29 — End: 2019-09-29
  Administered 2019-09-29: 150 mg via INTRAMUSCULAR

## 2019-09-29 NOTE — Progress Notes (Signed)
Here for depoprovera . Per chart needs annual exam. Will see provider today. Depoprovera given without complaint. Jakai Risse,RN

## 2019-09-29 NOTE — Progress Notes (Signed)
GYNECOLOGY ANNUAL PREVENTATIVE CARE ENCOUNTER NOTE  History:     Kimberly Macias is a 39 y.o. [phone] female here for a routine annual gynecologic exam.    Current complaints: odorous urine- Smells flatus. Feels like when she passes gas she passes through her vagina and not her rectum.   Does drink alcohol regularly. 1-3 glasses of wine per day, sometimes liquor.  Denies abnormal vaginal bleeding, discharge, pelvic pain, problems with intercourse or other gynecologic concerns. Needs an annual exam for depo provera.    Gynecologic History No LMP recorded. Patient has had an injection. Contraception: Depo-Provera injections Last Pap: 2019. Results were: normal with negative HPV   Obstetric History OB History  Gravida Para Term Preterm AB Living  '4 2 2 ' 0 2 2  SAB TAB Ectopic Multiple Live Births  0 1 1 0 2    # Outcome Date GA Lbr Len/2nd Weight Sex Delivery Anes PTL Lv  4 Term 10/07/17 38w2d16:19 / 00:39 6 lb 0.7 oz (2.741 kg) F Vag-Spont EPI  LIV  3 Ectopic 07/05/16 436w5d       2 Term 01/13/09   7 lb 11 oz (3.487 kg) M Vag-Spont EPI N LIV     Complications: Gestational diabetes, diet controlled  1 TAB             Past Medical History:  Diagnosis Date  . Anemia   . Gestational diabetes 2010   giet controlled    Past Surgical History:  Procedure Laterality Date  . DIAGNOSTIC LAPAROSCOPY WITH REMOVAL OF ECTOPIC PREGNANCY N/A 07/05/2016   Procedure: DIAGNOSTIC LAPAROSCOPY WITH REMOVAL OF ECTOPIC PREGNANCY;  Surgeon: TaDonnamae JudeMD;  [facility];  Service: Gynecology;  Laterality: N/A;  . LAPAROSCOPIC UNILATERAL SALPINGECTOMY Left 07/05/2016   Procedure: LAPAROSCOPIC UNILATERAL SALPINGECTOMY;  Surgeon: TaDonnamae JudeMD;  Location: [facility];  Service: Gynecology;  Laterality: Left;    Current Outpatient Medications on File Prior to Visit  Medication Sig Dispense Refill  . ibuprofen (ADVIL) 800 MG tablet Take 1 tablet (800 mg total) by mouth 3 (three) times daily.  21 tablet 0  . nitrofurantoin, macrocrystal-monohydrate, (MACROBID) 100 MG capsule Take 1 capsule 2 times daily for 5 days 10 capsule 0  . ondansetron (ZOFRAN ODT) 4 MG disintegrating tablet Take 1 tablet (4 mg total) by mouth every 8 (eight) hours as needed for nausea or vomiting. 20 tablet 0   No current facility-administered medications on file prior to visit.    No Known Allergies  Social History:  reports that she has never smoked. She has never used smokeless tobacco. She reports current alcohol use. She reports that she does not use drugs.  No family history on file.  The following portions of the patient's history were reviewed and updated as appropriate: allergies, current medications, past family history, past medical history, past social history, past surgical history and problem list.  Review of Systems Pertinent items noted in HPI and remainder of comprehensive ROS otherwise negative.  Physical Exam:  BP 117/81   Pulse 64   Ht 5' 3.5" (1.613 m)   Wt 142 lb 3.2 oz (64.5 kg)   BMI 24.79 kg/m  CONSTITUTIONAL: Well-developed, well-nourished female in no acute distress.  HENT:  Normocephalic, atraumatic, External right and left ear normal. Oropharynx is clear and moist EYES: Conjunctivae and EOM are normal. Pupils are equal, round, and reactive to light. No scleral icterus.  NECK: Normal range of motion, supple, no masses.  Normal thyroid.  SKIN: Skin is warm and dry. No rash noted. Not diaphoretic. No erythema. No pallor. MUSCULOSKELETAL: Normal range of motion. No tenderness.  No cyanosis, clubbing, or edema.  2+ distal pulses. NEUROLOGIC: Alert and oriented to person, place, and time. Normal reflexes, muscle tone coordination.  PSYCHIATRIC: Normal mood and affect. Normal behavior. Normal judgment and thought content. CARDIOVASCULAR: Normal heart rate noted, regular rhythm RESPIRATORY: Clear to auscultation bilaterally. Effort and breath sounds normal, no problems with  respiration noted. BREASTS: Symmetric in size. No masses, tenderness, skin changes, nipple drainage, or lymphadenopathy bilaterally. Performed in the presence of a chaperone. ABDOMEN: Soft, no distention noted.  No tenderness, rebound or guarding. 2/ 5 mm dark brown/black moles noted on abdomen. One mole with irregular boarders. Non tender.         Assessment and Plan:   1. Encounter for annual routine gynecological examination  - Not due for Pap - CBC w/Diff - Comp Met (CMET) - Urine Culture - HgB A1c  2. Abnormal urine odor  - Urine Culture - Ambulatory referral to Urology  3. Change in skin mole  - Ambulatory referral to dermatology     Routine preventative health maintenance measures emphasized. Please refer to After Visit Summary for other counseling recommendations.      Aloys Hupfer, Artist Pais, [place] for [employer], [place]

## 2019-09-30 LAB — CBC WITH DIFFERENTIAL/PLATELET
Basophils Absolute: 0.1 10*3/uL (ref 0.0–0.2)
Basos: 1 %
EOS (ABSOLUTE): 0.1 10*3/uL (ref 0.0–0.4)
Eos: 1 %
Hematocrit: 42.8 % (ref 34.0–46.6)
Hemoglobin: 14.2 g/dL (ref 11.1–15.9)
Immature Grans (Abs): 0 10*3/uL (ref 0.0–0.1)
Immature Granulocytes: 0 %
Lymphocytes Absolute: 1.9 10*3/uL (ref 0.7–3.1)
Lymphs: 41 %
MCH: 32.1 pg (ref 26.6–33.0)
MCHC: 33.2 g/dL (ref 31.5–35.7)
MCV: 97 fL (ref 79–97)
Monocytes Absolute: 0.4 10*3/uL (ref 0.1–0.9)
Monocytes: 8 %
Neutrophils Absolute: 2.4 10*3/uL (ref 1.4–7.0)
Neutrophils: 49 %
Platelets: 257 10*3/uL (ref 150–450)
RBC: 4.43 x10E6/uL (ref 3.77–5.28)
RDW: 13.1 % (ref 11.7–15.4)
WBC: 4.8 10*3/uL (ref 3.4–10.8)

## 2019-09-30 LAB — COMPREHENSIVE METABOLIC PANEL
ALT: 12 IU/L (ref 0–32)
AST: 13 IU/L (ref 0–40)
Albumin/Globulin Ratio: 1.5 (ref 1.2–2.2)
Albumin: 4.6 g/dL (ref 3.8–4.8)
Alkaline Phosphatase: 78 IU/L (ref 48–121)
BUN/Creatinine Ratio: 11 (ref 9–23)
BUN: 10 mg/dL (ref 6–20)
Bilirubin Total: 0.8 mg/dL (ref 0.0–1.2)
CO2: 22 mmol/L (ref 20–29)
Calcium: 9.8 mg/dL (ref 8.7–10.2)
Chloride: 103 mmol/L (ref 96–106)
Creatinine, Ser: 0.87 mg/dL (ref 0.57–1.00)
GFR calc Af Amer: 98 mL/min/{1.73_m2} (ref >59)
GFR calc non Af Amer: 85 mL/min/{1.73_m2} (ref >59)
Globulin, Total: 3 g/dL (ref 1.5–4.5)
Glucose: 85 mg/dL (ref 65–99)
Potassium: 4.1 mmol/L (ref 3.5–5.2)
Sodium: 140 mmol/L (ref 134–144)
Total Protein: 7.6 g/dL (ref 6.0–8.5)

## 2019-09-30 LAB — HEMOGLOBIN A1C
Est. average glucose Bld gHb Est-mCnc: 103 mg/dL
Hgb A1c MFr Bld: 5.2 % (ref 4.8–5.6)

## 2019-10-02 LAB — URINE CULTURE

## 2019-10-05 ENCOUNTER — Telehealth: Payer: Self-pay | Admitting: Obstetrics and Gynecology

## 2019-10-05 MED ORDER — SULFAMETHOXAZOLE-TRIMETHOPRIM 800-160 MG PO TABS: 1.0000 | ORAL_TABLET | Freq: Two times a day (BID) | ORAL | 0 refills | Status: AC

## 2019-10-05 MED ORDER — FLUCONAZOLE 150 MG PO TABS
150.0000 mg | ORAL_TABLET | Freq: Once | ORAL | 0 refills | Status: AC
Start: 2019-10-05 — End: 2019-10-05

## 2019-10-05 NOTE — Telephone Encounter (Signed)
Returned patients call. She was asking about Macrobid prescription. There is no indication of prescription written.   Patient reports she has no symptoms of UTI. Informed her she + Echoli UTI on 6/10 and ATB being sent to Pharmacy. She has a history of UTI's.   Patient reports she gets a yeast infection after ATB, Diflucan sent to take after ATB complete. Patient voiced understanding.   She has made appt with Dermatology and Urology.   Patient with no further questions or concerns.

## 2019-10-05 NOTE — Telephone Encounter (Signed)
Please call patient about her medacation. She was here on 06/10, and she thought the provide had sent in a Rx for her. She is requesting a call back.

## 2019-10-06 NOTE — Progress Notes (Signed)
Chart reviewed for nurse visit. Agree with plan of care.   Lezlie Lye, NP 10/06/2019 5:51 PM

## 2019-10-19 ENCOUNTER — Encounter (INDEPENDENT_AMBULATORY_CARE_PROVIDER_SITE_OTHER): Payer: Self-pay | Admitting: Primary Care

## 2019-10-19 ENCOUNTER — Other Ambulatory Visit: Payer: Self-pay

## 2019-10-19 ENCOUNTER — Telehealth (INDEPENDENT_AMBULATORY_CARE_PROVIDER_SITE_OTHER): Payer: Medicaid Other | Admitting: Primary Care

## 2019-10-19 DIAGNOSIS — Z7689 Persons encountering health services in other specified circumstances: Secondary | ICD-10-CM

## 2019-10-19 DIAGNOSIS — R8290 Unspecified abnormal findings in urine: Secondary | ICD-10-CM

## 2019-10-19 NOTE — Progress Notes (Signed)
Virtual Visit via Telephone Note  I connected with Kimberly Macias on 10/19/19 at  1:50 PM EDT by telephone and verified that I am speaking with the correct person using two identifiers.   I discussed the limitations, risks, security and privacy concerns of performing an evaluation and management service by telephone and the availability of in person appointments. I also discussed with the patient that there may be a patient responsible charge related to this service. The patient expressed understanding and agreed to proceed.   History of Present Illness: Kimberly Macias is a 39 year old female who is having a tele visit to establish care. She does have a urology appointment October 26, 2019 for frequent urine odor.   Past Medical History:  Diagnosis Date  . Anemia   . Gestational diabetes 2010   giet controlled    Observations/Objective: Review of Systems  Genitourinary:       Odor in urine   All other systems reviewed and are negative.  Assessment and Plan: Kimberly Macias was seen today for new patient (initial visit).  Diagnoses and all orders for this visit:  Abnormal urine odor OBGYN referred her to urology appointment schedule   Encounter to establish care Kimberly Mire, NP-C will be your  (PCP) she is mastered prepared . Able to diagnosed and treatment also  answer health concern as well as continuing care of varied medical conditions, not limited by cause, organ system, or diagnosis.     Follow Up Instructions:    I discussed the assessment and treatment plan with the patient. The patient was provided an opportunity to ask questions and all were answered. The patient agreed with the plan and demonstrated an understanding of the instructions.   The patient was advised to call back or seek an in-person evaluation if the symptoms worsen or if the condition fails to improve as anticipated.  I provided 16 minutes of non-face-to-face time during this encounter. Reviewing previous  encounters, labs and imaging 2019   Kimberly Perna, NP

## 2019-10-19 NOTE — Progress Notes (Signed)
Very foul smelling urine She is followed by urology

## 2019-10-26 ENCOUNTER — Ambulatory Visit: Payer: Medicaid Other | Admitting: Medical

## 2019-12-14 ENCOUNTER — Telehealth (INDEPENDENT_AMBULATORY_CARE_PROVIDER_SITE_OTHER): Payer: Self-pay

## 2019-12-14 NOTE — Telephone Encounter (Signed)
Reason for CRM: Pt called to report her sick symptoms. She has a cough, congestion, chills, aches, fever. Pt declined appt, states that next available is too far away.  Best contact: [phone]

## 2019-12-14 NOTE — Telephone Encounter (Signed)
Contacted pt and left a detailed vm informing her of Sharyn Lull response and if she has any questions or concerns to give a call

## 2019-12-15 ENCOUNTER — Ambulatory Visit: Payer: Medicaid Other

## 2020-01-19 ENCOUNTER — Other Ambulatory Visit: Payer: Self-pay

## 2020-01-19 ENCOUNTER — Ambulatory Visit (INDEPENDENT_AMBULATORY_CARE_PROVIDER_SITE_OTHER): Payer: Medicaid Other | Admitting: Primary Care

## 2020-01-19 ENCOUNTER — Encounter (INDEPENDENT_AMBULATORY_CARE_PROVIDER_SITE_OTHER): Payer: Self-pay | Admitting: Primary Care

## 2020-01-19 VITALS — BP 113/68 | HR 70 | Temp 96.8°F | Ht 65.0 in | Wt 145.2 lb

## 2020-01-19 DIAGNOSIS — Z0001 Encounter for general adult medical examination with abnormal findings: Secondary | ICD-10-CM | POA: Diagnosis not present

## 2020-01-19 DIAGNOSIS — R8290 Unspecified abnormal findings in urine: Secondary | ICD-10-CM | POA: Diagnosis not present

## 2020-01-19 DIAGNOSIS — Z0000 Encounter for general adult medical examination without abnormal findings: Secondary | ICD-10-CM | POA: Diagnosis not present

## 2020-01-19 DIAGNOSIS — N390 Urinary tract infection, site not specified: Secondary | ICD-10-CM

## 2020-01-19 DIAGNOSIS — Z130 Encounter for screening for diseases of the blood and blood-forming organs and certain disorders involving the immune mechanism: Secondary | ICD-10-CM | POA: Diagnosis not present

## 2020-01-19 LAB — POCT URINALYSIS DIP (CLINITEK)
Bilirubin, UA: NEGATIVE
Glucose, UA: NEGATIVE mg/dL
Ketones, POC UA: NEGATIVE mg/dL
Nitrite, UA: NEGATIVE
Spec Grav, UA: 1.025 (ref 1.010–1.025)
Urobilinogen, UA: 1 E.U./dL
pH, UA: 6 (ref 5.0–8.0)

## 2020-01-19 NOTE — Patient Instructions (Signed)
Health Maintenance, Female Adopting a healthy lifestyle and getting preventive care are important in promoting health and wellness. Ask your health care provider about:  The right schedule for you to have regular tests and exams.  Things you can do on your own to prevent diseases and keep yourself healthy. What should I know about diet, weight, and exercise? Eat a healthy diet   Eat a diet that includes plenty of vegetables, fruits, low-fat dairy products, and lean protein.  Do not eat a lot of foods that are high in solid fats, added sugars, or sodium. Maintain a healthy weight Body mass index (BMI) is used to identify weight problems. It estimates body fat based on height and weight. Your health care provider can help determine your BMI and help you achieve or maintain a healthy weight. Get regular exercise Get regular exercise. This is one of the most important things you can do for your health. Most adults should:  Exercise for at least 150 minutes each week. The exercise should increase your heart rate and make you sweat (moderate-intensity exercise).  Do strengthening exercises at least twice a week. This is in addition to the moderate-intensity exercise.  Spend less time sitting. Even light physical activity can be beneficial. Watch cholesterol and blood lipids Have your blood tested for lipids and cholesterol at 38 years of age, then have this test every 5 years. Have your cholesterol levels checked more often if:  Your lipid or cholesterol levels are high.  You are older than 38 years of age.  You are at high risk for heart disease. What should I know about cancer screening? Depending on your health history and family history, you may need to have cancer screening at various ages. This may include screening for:  Breast cancer.  Cervical cancer.  Colorectal cancer.  Skin cancer.  Lung cancer. What should I know about heart disease, diabetes, and high blood  pressure? Blood pressure and heart disease  High blood pressure causes heart disease and increases the risk of stroke. This is more likely to develop in people who have high blood pressure readings, are of African descent, or are overweight.  Have your blood pressure checked: ? Every 3-5 years if you are 18-39 years of age. ? Every year if you are 40 years old or older. Diabetes Have regular diabetes screenings. This checks your fasting blood sugar level. Have the screening done:  Once every three years after age 40 if you are at a normal weight and have a low risk for diabetes.  More often and at a younger age if you are overweight or have a high risk for diabetes. What should I know about preventing infection? Hepatitis B If you have a higher risk for hepatitis B, you should be screened for this virus. Talk with your health care provider to find out if you are at risk for hepatitis B infection. Hepatitis C Testing is recommended for:  Everyone born from 1945 through 1965.  Anyone with known risk factors for hepatitis C. Sexually transmitted infections (STIs)  Get screened for STIs, including gonorrhea and chlamydia, if: ? You are sexually active and are younger than 38 years of age. ? You are older than 38 years of age and your health care provider tells you that you are at risk for this type of infection. ? Your sexual activity has changed since you were last screened, and you are at increased risk for chlamydia or gonorrhea. Ask your health care provider if   you are at risk.  Ask your health care provider about whether you are at high risk for HIV. Your health care provider may recommend a prescription medicine to help prevent HIV infection. If you choose to take medicine to prevent HIV, you should first get tested for HIV. You should then be tested every 3 months for as long as you are taking the medicine. Pregnancy  If you are about to stop having your period (premenopausal) and  you may become pregnant, seek counseling before you get pregnant.  Take 400 to 800 micrograms (mcg) of folic acid every day if you become pregnant.  Ask for birth control (contraception) if you want to prevent pregnancy. Osteoporosis and menopause Osteoporosis is a disease in which the bones lose minerals and strength with aging. This can result in bone fractures. If you are 65 years old or older, or if you are at risk for osteoporosis and fractures, ask your health care provider if you should:  Be screened for bone loss.  Take a calcium or vitamin D supplement to lower your risk of fractures.  Be given hormone replacement therapy (HRT) to treat symptoms of menopause. Follow these instructions at home: Lifestyle  Do not use any products that contain nicotine or tobacco, such as cigarettes, e-cigarettes, and chewing tobacco. If you need help quitting, ask your health care provider.  Do not use street drugs.  Do not share needles.  Ask your health care provider for help if you need support or information about quitting drugs. Alcohol use  Do not drink alcohol if: ? Your health care provider tells you not to drink. ? You are pregnant, may be pregnant, or are planning to become pregnant.  If you drink alcohol: ? Limit how much you use to 0-1 drink a day. ? Limit intake if you are breastfeeding.  Be aware of how much alcohol is in your drink. In the [place] one drink equals one 12 oz bottle of beer (355 mL), one 5 oz glass of wine (148 mL), or one 1 oz glass of hard liquor (44 mL). General instructions  Schedule regular health, dental, and eye exams.  Stay current with your vaccines.  Tell your health care provider if: ? You often feel depressed. ? You have ever been abused or do not feel safe at home. Summary  Adopting a healthy lifestyle and getting preventive care are important in promoting health and wellness.  Follow your health care provider's instructions about healthy  diet, exercising, and getting tested or screened for diseases.  Follow your health care provider's instructions on monitoring your cholesterol and blood pressure. This information is not intended to replace advice given to you by your health care provider. Make sure you discuss any questions you have with your health care provider. Document Revised: 03/31/2018 Document Reviewed: 03/31/2018 Elsevier Patient Education  2020 Elsevier Inc.  

## 2020-01-19 NOTE — Progress Notes (Signed)
Kimberly Macias is a 39 y.o. female presents to office today for annual physical exam examination.    Concerns today include: 1. Malodorous urine  Occupation: stay at home mom, Marital status: S Substance use: NO Diet: healthy , Exercise: no Last eye exam: unknown  Last dental exam: 12/21/2019  Last pap smear: 6/21 Refills needed today: no Immunizations needed: Flu Vaccine: no  Tdap Vaccine: yes  - every 17yrs - (<3 lifetime doses or unknown): all wounds -- look up need for Tetanus IG - (>=3 lifetime doses): clean/minor wound if >33yrs from previous; all other wounds if >39yrs from previous Zoster Vaccine: no (those >50yo, once) Pneumonia Vaccine: no (those w/ risk factors) - (<81yr) Both: Immunocompromised, cochlear implant, CSF leak, asplenic, sickle cell, Chronic Renal Failure - (<59yr) PPSV-23 only: Heart dz, lung disease, DM, tobacco abuse, alcoholism, cirrhosis/liver disease. - (>42yr): PPSV13 then PPSV23 in 6-12mths;  - (>53yr): repeat PPSV23 once if pt received prior to 39yo and 52yrs have passed  Past Medical History:  Diagnosis Date   Anemia    Gestational diabetes 2010   giet controlled   Social History   Socioeconomic History   Marital status: Single    Spouse name: Not on file   Number of children: Not on file   Years of education: Not on file   Highest education level: Not on file  Occupational History   Not on file  Tobacco Use   Smoking status: Never Smoker   Smokeless tobacco: Never Used  Vaping Use   Vaping Use: Never used  Substance and Sexual Activity   Alcohol use: Yes    Comment: occasional   Drug use: No   Sexual activity: Yes    Birth control/protection: Injection  Other Topics Concern   Not on file  Social History Narrative   Not on file   Social Determinants of Health   Financial Resource Strain:    Difficulty of Paying Living Expenses: Not on file  Food Insecurity: No Food Insecurity   Worried About Running Out of  Food in the Last Year: Never true   Ran Out of Food in the Last Year: Never true  Transportation Needs: No Transportation Needs   Lack of Transportation (Medical): No   Lack of Transportation (Non-Medical): No  Physical Activity:    Days of Exercise per Week: Not on file   Minutes of Exercise per Session: Not on file  Stress:    Feeling of Stress : Not on file  Social Connections:    Frequency of Communication with Friends and Family: Not on file   Frequency of Social Gatherings with Friends and Family: Not on file   Attends Religious Services: Not on file   Active Member of Clubs or Organizations: Not on file   Attends Archivist Meetings: Not on file   Marital Status: Not on file  Intimate Partner Violence:    Fear of Current or Ex-Partner: Not on file   Emotionally Abused: Not on file   Physically Abused: Not on file   Sexually Abused: Not on file   Past Surgical History:  Procedure Laterality Date   DIAGNOSTIC LAPAROSCOPY WITH REMOVAL OF ECTOPIC PREGNANCY N/A 07/05/2016   Procedure: DIAGNOSTIC LAPAROSCOPY WITH REMOVAL OF ECTOPIC PREGNANCY;  Surgeon: [name], MD;  Location: [facility];  Service: Gynecology;  Laterality: N/A;   LAPAROSCOPIC UNILATERAL SALPINGECTOMY Left 07/05/2016   Procedure: LAPAROSCOPIC UNILATERAL SALPINGECTOMY;  Surgeon: [name], MD;  Location: [facility];  Service: Gynecology;  Laterality: Left;   No family history on file. No current outpatient medications on file.  No Known Allergies   ROS: Review of Systems Genitourinary:negative, Malodorous urine    Physical exam BP 113/68 (BP Location: Right Arm, Patient Position: Sitting, Cuff Size: Normal)    Pulse 70    Temp (!) 96.8 F (36 C) (Temporal)    Ht 5\' 5"  (1.651 m)    Wt 145 lb 3.2 oz (65.9 kg)    SpO2 99%    BMI 24.16 kg/m   General: Vital signs reviewed.  Patient is well-developed and well-nourished, in no acute distress and cooperative with exam.  Head:  Normocephalic and atraumatic. Eyes: EOMI, conjunctivae normal, no scleral icterus.  Neck: Supple, trachea midline, normal ROM, no JVD, masses, thyromegaly, or carotid bruit present.  Cardiovascular: RRR, S1 normal, S2 normal, no murmurs, gallops, or rubs. Pulmonary/Chest: Clear to auscultation bilaterally, no wheezes, rales, or rhonchi. Abdominal: Soft, non-tender, non-distended, BS +, no masses, organomegaly, or guarding present.  Musculoskeletal: No joint deformities, erythema, or stiffness, ROM full and nontender. Extremities: No lower extremity edema bilaterally,  pulses symmetric and intact bilaterally. No cyanosis or clubbing. Neurological: A&O x3, Strength is normal and symmetric bilaterally, cranial nerve II-XII are grossly intact, no focal motor deficit, sensory intact to light touch bilaterally.  Skin: Warm, dry and intact. No rashes or erythema. Psychiatric: Normal mood and affect. speech and behavior is normal. Cognition and memory are normal.   Assessment/ Plan: Kimberly Macias here for annual physical exam.   Kimberly Macias was seen today for annual exam.  Diagnoses and all orders for this visit:  Encounter for general adult medical examination with abnormal findings  Routine general medical examination at a health care facility -     CBC with Differential  Screening for deficiency anemia CBC  Malodorous urine -     POCT URINALYSIS DIP (CLINITEK)  Urinary tract infection without hematuria, site unspecified -     Urine Culture    Counseled on healthy lifestyle choices, including diet (rich in fruits, vegetables and lean meats and low in salt and simple carbohydrates) and exercise (at least 30 minutes of moderate physical activity daily).  Patient to follow up in 1 year for annual exam or sooner if needed.  The above assessment and management plan was discussed with the patient. The patient verbalized understanding of and has agreed to the management plan. Patient is aware  to call the clinic if symptoms persist or worsen. Patient is aware when to return to the clinic for a follow-up visit. Patient educated on when it is appropriate to go to the emergency department.   Juluis Mire NP-C [address] [place] [phone]

## 2020-01-20 LAB — CBC WITH DIFFERENTIAL/PLATELET
Basophils Absolute: 0.1 10*3/uL (ref 0.0–0.2)
Basos: 1 %
EOS (ABSOLUTE): 0.1 10*3/uL (ref 0.0–0.4)
Eos: 1 %
Hematocrit: 41 % (ref 34.0–46.6)
Hemoglobin: 13.7 g/dL (ref 11.1–15.9)
Immature Grans (Abs): 0 10*3/uL (ref 0.0–0.1)
Immature Granulocytes: 1 %
Lymphocytes Absolute: 1.6 10*3/uL (ref 0.7–3.1)
Lymphs: 27 %
MCH: 32.2 pg (ref 26.6–33.0)
MCHC: 33.4 g/dL (ref 31.5–35.7)
MCV: 96 fL (ref 79–97)
Monocytes Absolute: 0.5 10*3/uL (ref 0.1–0.9)
Monocytes: 9 %
Neutrophils Absolute: 3.6 10*3/uL (ref 1.4–7.0)
Neutrophils: 61 %
Platelets: 332 10*3/uL (ref 150–450)
RBC: 4.26 x10E6/uL (ref 3.77–5.28)
RDW: 12.8 % (ref 11.7–15.4)
WBC: 5.9 10*3/uL (ref 3.4–10.8)

## 2020-01-23 ENCOUNTER — Other Ambulatory Visit (INDEPENDENT_AMBULATORY_CARE_PROVIDER_SITE_OTHER): Payer: Self-pay | Admitting: Primary Care

## 2020-01-23 DIAGNOSIS — N3000 Acute cystitis without hematuria: Secondary | ICD-10-CM

## 2020-01-23 LAB — URINE CULTURE

## 2020-01-23 MED ORDER — FLUCONAZOLE 150 MG PO TABS: 150.0000 mg | ORAL_TABLET | Freq: Once | ORAL | 0 refills | Status: AC

## 2020-01-23 MED ORDER — NITROFURANTOIN MONOHYD MACRO 100 MG PO CAPS: 100.0000 mg | ORAL_CAPSULE | Freq: Two times a day (BID) | ORAL | 0 refills | Status: DC

## 2020-02-29 ENCOUNTER — Other Ambulatory Visit: Payer: Self-pay

## 2020-02-29 ENCOUNTER — Telehealth (INDEPENDENT_AMBULATORY_CARE_PROVIDER_SITE_OTHER): Payer: Medicaid Other | Admitting: Primary Care

## 2020-02-29 ENCOUNTER — Encounter (INDEPENDENT_AMBULATORY_CARE_PROVIDER_SITE_OTHER): Payer: Self-pay | Admitting: Primary Care

## 2020-02-29 DIAGNOSIS — R6889 Other general symptoms and signs: Secondary | ICD-10-CM

## 2020-02-29 MED ORDER — OSELTAMIVIR PHOSPHATE 75 MG PO CAPS: 75.0000 mg | ORAL_CAPSULE | Freq: Two times a day (BID) | ORAL | 0 refills | Status: DC

## 2020-02-29 NOTE — Progress Notes (Signed)
Onset of symptoms Saturday  Chills, back pain, headache and night sweats

## 2020-02-29 NOTE — Progress Notes (Signed)
Telephone Note  I connected with Kimberly Macias on 02/29/20 at  2:10 PM EST by telephone and verified that I am speaking with the correct person using two identifiers.  Location: Patient: home Provider: Kerin Perna @ [facility]   I discussed the limitations, risks, security and privacy concerns of performing an evaluation and management service by telephone and the availability of in person appointments. I also discussed with the patient that there may be a patient responsible charge related to this service. The patient expressed understanding and agreed to proceed.   History of Present Illness: [name] is a 39 year old female she feels she has the flu chills, fever headache and body. She does not feel she has COVID. Called [facility] to ask if any cases of the flu and was told no. Share information. Past Medical History:  Diagnosis Date   Anemia    Gestational diabetes 2010   giet controlled    No current outpatient medications on file prior to visit.   No current facility-administered medications on file prior to visit.    Observations/Objective: Review of Systems  Constitutional: Positive for chills and fever.       Loss of appetite  Wakes up sweating  Body ache   Respiratory: Positive for cough.   Neurological: Positive for headaches.  All other systems reviewed and are negative.  Assessment and Plan: Kimberly Macias was seen today for cold like symptoms.  Diagnoses and all orders for this visit:  Flu-like symptoms -     oseltamivir (TAMIFLU) 75 MG capsule; Take 1 capsule (75 mg total) by mouth 2 (two) times daily.    Follow Up Instructions:    I discussed the assessment and treatment plan with the patient. The patient was provided an opportunity to ask questions and all were answered. The patient agreed with the plan and demonstrated an understanding of the instructions.   The patient was advised to call back or seek an in-person evaluation if the symptoms worsen  or if the condition fails to improve as anticipated.  I provided 12 minutes of non-face-to-face time during this encounter.   Kerin Perna, NP

## 2020-03-13 ENCOUNTER — Other Ambulatory Visit: Payer: Self-pay

## 2020-03-13 ENCOUNTER — Ambulatory Visit (HOSPITAL_COMMUNITY)
Admission: EM | Admit: 2020-03-13 | Discharge: 2020-03-13 | Disposition: A | Discharge status: Home / Self Care | Payer: Medicaid Other | Attending: Urgent Care | Admitting: Urgent Care

## 2020-03-13 ENCOUNTER — Encounter (HOSPITAL_COMMUNITY): Payer: Self-pay | Admitting: Emergency Medicine

## 2020-03-13 DIAGNOSIS — N10 Acute tubulo-interstitial nephritis: Secondary | ICD-10-CM | POA: Insufficient documentation

## 2020-03-13 DIAGNOSIS — M549 Dorsalgia, unspecified: Secondary | ICD-10-CM | POA: Diagnosis not present

## 2020-03-13 LAB — POCT URINALYSIS DIPSTICK, ED / UC
Glucose, UA: NEGATIVE mg/dL
Ketones, ur: 15 mg/dL — AB
Leukocytes,Ua: NEGATIVE
Nitrite: NEGATIVE
Protein, ur: 30 mg/dL — AB
Specific Gravity, Urine: >=1.03 (ref 1.005–1.030)
Urobilinogen, UA: 1 mg/dL (ref 0.0–1.0)
pH: 5 (ref 5.0–8.0)

## 2020-03-13 LAB — POC URINE PREG, ED: Preg Test, Ur: NEGATIVE

## 2020-03-13 MED ORDER — CEFTRIAXONE SODIUM 1 G IJ SOLR
1.0000 g | Freq: Once | INTRAMUSCULAR | Status: AC
  Administered 2020-03-13: 1 g via INTRAMUSCULAR

## 2020-03-13 MED ORDER — LIDOCAINE HCL (PF) 1 % IJ SOLN
INTRAMUSCULAR | Status: AC
  Filled 2020-03-13: qty 2

## 2020-03-13 MED ORDER — CEPHALEXIN 500 MG PO CAPS
500.0000 mg | ORAL_CAPSULE | Freq: Two times a day (BID) | ORAL | 0 refills | Status: DC
Start: 2020-03-13 — End: 2020-12-04

## 2020-03-13 MED ORDER — ACETAMINOPHEN 500 MG PO TABS
500.0000 mg | ORAL_TABLET | Freq: Four times a day (QID) | ORAL | 0 refills | Status: DC | PRN
Start: 2020-03-13 — End: 2020-12-04

## 2020-03-13 MED ORDER — CEFTRIAXONE SODIUM 1 G IJ SOLR
INTRAMUSCULAR | Status: AC
  Filled 2020-03-13: qty 10

## 2020-03-13 NOTE — Discharge Instructions (Signed)
Make sure you hydrate very well with plain water and a quantity of 64 ounces of water a day.  Please limit drinks that are considered urinary irritants such as soda, sweet tea, coffee, energy drinks, alcohol.  These can worsen your UTI symptoms and also be the source of them.  I will let you know about your urine culture results through MyChart to see if we need to change your antibiotics based off of those results.  Do not use any nonsteroidal anti-inflammatories (NSAIDs) like ibuprofen, Motrin, naproxen, Aleve, etc. which are all available over-the-counter.  Please just use Tylenol at a dose of 500mg -750mg  once every 6 hours as needed for your aches, pains, fevers.

## 2020-03-13 NOTE — ED Triage Notes (Signed)
Onset Saturday of fever, back pain, and right side pain.  Denies urinary symptoms.  Last bm was 11/20.  Last bm was normal per patient.    Denies falls

## 2020-03-13 NOTE — ED Provider Notes (Signed)
[place]   MRN: [id number] DOB: 08-Dec-1980  Subjective:   Kimberly Macias is a 39 y.o. female presenting for 4-day history of acute onset right-sided flank and abdominal pain, subjective fever and malaise.  Denies dysuria, urinary frequency, hematuria.  Patient struggles with constipation, last bowel movement was 2 days ago.  Has also had difficulty hydrating with plain water.  Admits that she rarely drinks any.  She drinks alcohol on juice instead.  Has had to see a urologist for persistent UTIs and kidney stones.  Also generally has a malodorous urine.  No current facility-administered medications for this encounter.  Current Outpatient Medications:    ciprofloxacin (CIPRO) 500 MG tablet, Take 500 mg by mouth 2 (two) times daily. THIS WAS OLD MEDICINE: TOOK ONE TABLET 2 TIMES A DAY FOR 2 DAYS (TOTAL OF 4 TABLETS HAVE BEEN TAKEN OVER THE COURSE OF 2 DAYS), Disp: , Rfl:    oseltamivir (TAMIFLU) 75 MG capsule, Take 1 capsule (75 mg total) by mouth 2 (two) times daily., Disp: 14 capsule, Rfl: 0   No Known Allergies  Past Medical History:  Diagnosis Date   Anemia    Gestational diabetes 2010   giet controlled     Past Surgical History:  Procedure Laterality Date   DIAGNOSTIC LAPAROSCOPY WITH REMOVAL OF ECTOPIC PREGNANCY N/A 07/05/2016   Procedure: DIAGNOSTIC LAPAROSCOPY WITH REMOVAL OF ECTOPIC PREGNANCY;  Surgeon: [name], MD;  Location: [facility];  Service: Gynecology;  Laterality: N/A;   LAPAROSCOPIC UNILATERAL SALPINGECTOMY Left 07/05/2016   Procedure: LAPAROSCOPIC UNILATERAL SALPINGECTOMY;  Surgeon: [name], MD;  Location: [facility];  Service: Gynecology;  Laterality: Left;    History reviewed. No pertinent family history.  Social History   Tobacco Use   Smoking status: Never Smoker   Smokeless tobacco: Never Used  Vaping Use   Vaping Use: Never used  Substance Use Topics   Alcohol use: Yes    Comment: occasional   Drug use: No     ROS   Objective:   Vitals: BP 111/72 (BP Location: Right Arm)    Pulse 83    Temp 97.9 F (36.6 C) (Oral)    Resp 18    SpO2 99%   Physical Exam Constitutional:      General: She is not in acute distress.    Appearance: Normal appearance. She is well-developed and normal weight. She is not ill-appearing, toxic-appearing or diaphoretic.  HENT:     Head: Normocephalic and atraumatic.     Right Ear: External ear normal.     Left Ear: External ear normal.     Nose: Nose normal.     Mouth/Throat:     Mouth: Mucous membranes are moist.     Pharynx: Oropharynx is clear.  Eyes:     General: No scleral icterus.    Extraocular Movements: Extraocular movements intact.     Pupils: Pupils are equal, round, and reactive to light.  Cardiovascular:     Rate and Rhythm: Normal rate and regular rhythm.     Pulses: Normal pulses.     Heart sounds: Normal heart sounds. No murmur heard.  No friction rub. No gallop.   Pulmonary:     Effort: Pulmonary effort is normal. No respiratory distress.     Breath sounds: Normal breath sounds. No stridor. No wheezing, rhonchi or rales.  Abdominal:     General: Bowel sounds are normal. There is no distension.     Palpations: Abdomen is soft.  There is no mass.     Tenderness: There is abdominal tenderness (right sided). There is right CVA tenderness. There is no left CVA tenderness, guarding or rebound.  Skin:    General: Skin is warm and dry.     Coloration: Skin is not pale.     Findings: No rash.  Neurological:     General: No focal deficit present.     Mental Status: She is alert and oriented to person, place, and time.  Psychiatric:        Mood and Affect: Mood normal.        Behavior: Behavior normal.        Thought Content: Thought content normal.        Judgment: Judgment normal.     Results for orders placed or performed during the hospital encounter of 03/13/20 (from the past 24 hour(s))  POC Urinalysis dipstick     Status: Abnormal    Collection Time: 03/13/20  3:31 PM  Result Value Ref Range   Glucose, UA NEGATIVE NEGATIVE mg/dL   Bilirubin Urine SMALL (A) NEGATIVE   Ketones, ur 15 (A) NEGATIVE mg/dL   Specific Gravity, Urine >=1.030 1.005 - 1.030   Hgb urine dipstick MODERATE (A) NEGATIVE   pH 5.0 5.0 - 8.0   Protein, ur 30 (A) NEGATIVE mg/dL   Urobilinogen, UA 1.0 0.0 - 1.0 mg/dL   Nitrite NEGATIVE NEGATIVE   Leukocytes,Ua NEGATIVE NEGATIVE  POC urine pregnancy     Status: None   Collection Time: 03/13/20  3:40 PM  Result Value Ref Range   Preg Test, Ur NEGATIVE NEGATIVE    Assessment and Plan :   PDMP not reviewed this encounter.  1. Acute pyelonephritis   2. Costovertebral angle tenderness     Will cover for pyelonephritis with IM ceftriaxone, cephalexin as an outpatient.  Urine culture pending.  Emphasized need to start hydrating with plain water more consistently.  Use Tylenol for pain control.  Counseled on the possibility of a kidney stone as well. Counseled patient on potential for adverse effects with medications prescribed/recommended today, ER and return-to-clinic precautions discussed, patient verbalized understanding.    Jaynee Eagles, PA-C 03/13/20 1702

## 2020-03-14 LAB — URINE CULTURE: Culture: NO GROWTH

## 2020-03-27 ENCOUNTER — Other Ambulatory Visit: Payer: Self-pay

## 2020-03-27 ENCOUNTER — Ambulatory Visit (INDEPENDENT_AMBULATORY_CARE_PROVIDER_SITE_OTHER): Payer: Medicaid Other | Admitting: General Practice

## 2020-03-27 VITALS — BP 116/65 | HR 64 | Ht 64.0 in | Wt 146.0 lb

## 2020-03-27 DIAGNOSIS — Z30013 Encounter for initial prescription of injectable contraceptive: Secondary | ICD-10-CM | POA: Diagnosis not present

## 2020-03-27 LAB — POCT PREGNANCY, URINE: Preg Test, Ur: NEGATIVE

## 2020-03-27 MED ORDER — MEDROXYPROGESTERONE ACETATE 150 MG/ML IM SUSP
150.0000 mg | Freq: Once | INTRAMUSCULAR | Status: AC
  Administered 2020-03-27: 150 mg via INTRAMUSCULAR

## 2020-03-27 NOTE — Progress Notes (Signed)
[name] here for Depo-Provera Injection. Patient's last injection was 09/29/19- LMP 11/23 with recent unprotected intercourse. Obtained negative UPT today. Injection administered without complication. Advised using backup protection for the next 2 weeks. Patient will return in 3 months for next injection between Feb 22 and Mar 8. Next annual visit due June 2022.   Derinda Late, RN 03/27/2020  11:57 AM

## 2020-03-27 NOTE — Progress Notes (Signed)
Patient was assessed and managed by nursing staff during this encounter. I have reviewed the chart and agree with the documentation and plan. I have also made any necessary editorial changes.  Gabriel Carina, CNM 03/27/2020 1:31 PM

## 2020-05-10 ENCOUNTER — Ambulatory Visit (HOSPITAL_COMMUNITY)
Admission: EM | Admit: 2020-05-10 | Discharge: 2020-05-10 | Disposition: A | Discharge status: Home / Self Care | Payer: Medicaid Other

## 2020-05-10 ENCOUNTER — Other Ambulatory Visit: Payer: Self-pay

## 2020-06-12 ENCOUNTER — Ambulatory Visit: Payer: Medicaid Other

## 2020-08-01 ENCOUNTER — Ambulatory Visit: Payer: Medicaid Other

## 2020-08-20 ENCOUNTER — Ambulatory Visit: Payer: Medicaid Other

## 2020-09-18 ENCOUNTER — Other Ambulatory Visit: Payer: Self-pay

## 2020-09-18 ENCOUNTER — Ambulatory Visit (INDEPENDENT_AMBULATORY_CARE_PROVIDER_SITE_OTHER): Payer: Medicaid Other

## 2020-09-18 VITALS — BP 129/84 | HR 71 | Wt 145.0 lb

## 2020-09-18 DIAGNOSIS — Z3042 Encounter for surveillance of injectable contraceptive: Secondary | ICD-10-CM | POA: Diagnosis not present

## 2020-09-18 LAB — POCT PREGNANCY, URINE: Preg Test, Ur: NEGATIVE

## 2020-09-18 MED ORDER — MEDROXYPROGESTERONE ACETATE 150 MG/ML IM SUSP
150.0000 mg | Freq: Once | INTRAMUSCULAR | Status: AC
  Administered 2020-09-18: 150 mg via INTRAMUSCULAR

## 2020-09-18 NOTE — Progress Notes (Signed)
I agree with the findings and care plan as documented in the RN note.    Sharene Skeans, MD [facility] Family Medicine Fellow, [facility] for [facility], [place]

## 2020-09-18 NOTE — Progress Notes (Signed)
[name] here for Depo-Provera Injection. Injection administered without complication. Patient will return in 3 months for next injection between Aug 16 and Aug 30. Next annual visit due with next Depo Provera.   Pt last dose of Depo Provera was administered on 03/2020.  Pt reports that her last intercourse was about a week and a half ago.  Pt is also unsure of LMP.  Negative pregnancy test today.  Reviewed chart with Dr. Berniece Andreas who states that pt can have Depo Provera today however to inform pt that she does risk the chance of miscarriage knowing that she has had unprotected sex and the negative test today could be testing too early.  Notified pt of provider's recommendation.  Pt stated that she wants to get the Depo Provera today knowing that her negative pregnancy test today does not mean that she can not be pregnant and could cause a miscarriage.  Pt verbalized understanding.  I advised pt that this is the third time that she has been late taking Depo Provera.  I advised pt that she should think of something long term if she does not desire pregnancy.  Pt states that if she is late with her next Depo Provera then she will discuss with the provider about a different birth control method.    Verdell Carmine, RN 09/18/2020  2:02 PM

## 2020-11-25 ENCOUNTER — Emergency Department (HOSPITAL_COMMUNITY): Payer: Medicaid Other

## 2020-11-25 ENCOUNTER — Ambulatory Visit (HOSPITAL_COMMUNITY)
Admission: EM | Admit: 2020-11-25 | Discharge: 2020-11-25 | Disposition: A | Discharge status: Home / Self Care | Payer: Medicaid Other

## 2020-11-25 ENCOUNTER — Other Ambulatory Visit: Payer: Self-pay

## 2020-11-25 ENCOUNTER — Emergency Department (HOSPITAL_COMMUNITY)
Admission: EM | Admit: 2020-11-25 | Discharge: 2020-11-25 | Disposition: A | Discharge status: Home / Self Care | Payer: Medicaid Other | Attending: Emergency Medicine | Admitting: Emergency Medicine

## 2020-11-25 ENCOUNTER — Encounter (HOSPITAL_COMMUNITY): Payer: Self-pay | Admitting: Emergency Medicine

## 2020-11-25 DIAGNOSIS — S60222A Contusion of left hand, initial encounter: Secondary | ICD-10-CM | POA: Diagnosis not present

## 2020-11-25 DIAGNOSIS — S0003XA Contusion of scalp, initial encounter: Secondary | ICD-10-CM | POA: Insufficient documentation

## 2020-11-25 DIAGNOSIS — M25552 Pain in left hip: Secondary | ICD-10-CM | POA: Insufficient documentation

## 2020-11-25 DIAGNOSIS — M25561 Pain in right knee: Secondary | ICD-10-CM | POA: Insufficient documentation

## 2020-11-25 DIAGNOSIS — M25512 Pain in left shoulder: Secondary | ICD-10-CM | POA: Insufficient documentation

## 2020-11-25 DIAGNOSIS — S0990XA Unspecified injury of head, initial encounter: Secondary | ICD-10-CM | POA: Diagnosis present

## 2020-11-25 DIAGNOSIS — Y92410 Unspecified street and highway as the place of occurrence of the external cause: Secondary | ICD-10-CM | POA: Insufficient documentation

## 2020-11-25 DIAGNOSIS — S060X0A Concussion without loss of consciousness, initial encounter: Secondary | ICD-10-CM

## 2020-11-25 DIAGNOSIS — R0781 Pleurodynia: Secondary | ICD-10-CM

## 2020-11-25 MED ORDER — HYDROCODONE-ACETAMINOPHEN 5-325 MG PO TABS: 1.0000 | ORAL_TABLET | Freq: Four times a day (QID) | ORAL | 0 refills | Status: DC | PRN

## 2020-11-25 MED ORDER — HYDROCODONE-ACETAMINOPHEN 5-325 MG PO TABS
2.0000 | ORAL_TABLET | Freq: Once | ORAL | Status: AC
  Administered 2020-11-25: 2 via ORAL
  Filled 2020-11-25: qty 2

## 2020-11-25 MED ORDER — IBUPROFEN 600 MG PO TABS: 600.0000 mg | ORAL_TABLET | Freq: Four times a day (QID) | ORAL | 0 refills | Status: DC | PRN

## 2020-11-25 MED ORDER — ONDANSETRON 4 MG PO TBDP
4.0000 mg | ORAL_TABLET | Freq: Once | ORAL | Status: AC
  Administered 2020-11-25: 4 mg via ORAL
  Filled 2020-11-25: qty 1

## 2020-11-25 MED ORDER — CYCLOBENZAPRINE HCL 5 MG PO TABS: 5.0000 mg | ORAL_TABLET | Freq: Three times a day (TID) | ORAL | 0 refills | Status: DC | PRN

## 2020-11-25 NOTE — Discharge Instructions (Addendum)
Take motrin and flexeril for muscle spasms   Take Vicodin for severe pain   You may have some ongoing dizziness and headaches.  See your doctor for follow-up   Return to ER if you have worse headache, vomiting, chest pain, hip pain.

## 2020-11-25 NOTE — ED Notes (Signed)
Pt given sandwich bag and ice water.  

## 2020-11-25 NOTE — ED Triage Notes (Signed)
Provider in room  to eval PT.
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
## 2020-12-04 ENCOUNTER — Other Ambulatory Visit: Payer: Self-pay

## 2020-12-04 ENCOUNTER — Other Ambulatory Visit (HOSPITAL_COMMUNITY)
Admission: RE | Admit: 2020-12-04 | Discharge: 2020-12-04 | Disposition: A | Discharge status: Home / Self Care | Payer: Medicaid Other | Source: Ambulatory Visit | Attending: Family Medicine | Admitting: Family Medicine

## 2020-12-04 ENCOUNTER — Ambulatory Visit (INDEPENDENT_AMBULATORY_CARE_PROVIDER_SITE_OTHER): Payer: Self-pay | Admitting: Family Medicine

## 2020-12-04 ENCOUNTER — Encounter: Payer: Self-pay | Admitting: Family Medicine

## 2020-12-04 VITALS — BP 109/76 | HR 73 | Ht 64.0 in | Wt 146.0 lb

## 2020-12-04 DIAGNOSIS — Z124 Encounter for screening for malignant neoplasm of cervix: Secondary | ICD-10-CM | POA: Insufficient documentation

## 2020-12-04 DIAGNOSIS — Z3042 Encounter for surveillance of injectable contraceptive: Secondary | ICD-10-CM

## 2020-12-04 DIAGNOSIS — Z01419 Encounter for gynecological examination (general) (routine) without abnormal findings: Secondary | ICD-10-CM

## 2020-12-04 DIAGNOSIS — Z113 Encounter for screening for infections with a predominantly sexual mode of transmission: Secondary | ICD-10-CM | POA: Diagnosis present

## 2020-12-04 MED ORDER — MEDROXYPROGESTERONE ACETATE 150 MG/ML IM SUSP
150.0000 mg | Freq: Once | INTRAMUSCULAR | Status: AC
  Administered 2020-12-04: 150 mg via INTRAMUSCULAR

## 2020-12-04 NOTE — Assessment & Plan Note (Signed)
Cancer screening: - Pap: collected today - Mammogram: n/a - Colonoscopy: n/a  Contraception: Depo today  Mood: good  Metabolic: - DM: [id number] normal last year - Lipids: screening not indicated  ID: - STI: panel sent today  Given no symptoms of dysuria only strong odor recommended against empiric treatment, amenable to this plan.

## 2020-12-04 NOTE — Addendum Note (Signed)
Addended byMariane Baumgarten on: 12/04/2020 03:45 PM   Modules accepted: Orders
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
## 2020-12-05 LAB — RPR+HBSAG+HCVAB+...
HIV Screen 4th Generation wRfx: NONREACTIVE
Hep C Virus Ab: 0.1 s/co ratio (ref 0.0–0.9)
Hepatitis B Surface Ag: NEGATIVE
RPR Ser Ql: NONREACTIVE

## 2020-12-07 LAB — CYTOLOGY - PAP
Chlamydia: NEGATIVE
Comment: NEGATIVE
Comment: NEGATIVE
Comment: NEGATIVE
Comment: NORMAL
Diagnosis: NEGATIVE
Diagnosis: REACTIVE
High risk HPV: NEGATIVE
Neisseria Gonorrhea: NEGATIVE
Trichomonas: NEGATIVE

## 2021-05-07 ENCOUNTER — Ambulatory Visit: Payer: Self-pay

## 2021-05-08 ENCOUNTER — Ambulatory Visit (INDEPENDENT_AMBULATORY_CARE_PROVIDER_SITE_OTHER): Payer: Self-pay | Admitting: Primary Care

## 2021-05-17 ENCOUNTER — Other Ambulatory Visit: Payer: Self-pay

## 2021-05-17 ENCOUNTER — Encounter: Payer: Self-pay | Admitting: Family Medicine

## 2021-05-17 ENCOUNTER — Ambulatory Visit (INDEPENDENT_AMBULATORY_CARE_PROVIDER_SITE_OTHER): Payer: Medicaid Other

## 2021-05-17 VITALS — BP 115/81 | HR 70 | Wt 153.7 lb

## 2021-05-17 DIAGNOSIS — R8290 Unspecified abnormal findings in urine: Secondary | ICD-10-CM

## 2021-05-17 DIAGNOSIS — R102 Pelvic and perineal pain: Secondary | ICD-10-CM

## 2021-05-17 DIAGNOSIS — Z3042 Encounter for surveillance of injectable contraceptive: Secondary | ICD-10-CM

## 2021-05-17 LAB — POCT URINALYSIS DIP (DEVICE)
Bilirubin Urine: NEGATIVE
Glucose, UA: NEGATIVE mg/dL
Ketones, ur: NEGATIVE mg/dL
Leukocytes,Ua: NEGATIVE
Nitrite: POSITIVE — AB
Protein, ur: NEGATIVE mg/dL
Specific Gravity, Urine: >=1.03 (ref 1.005–1.030)
Urobilinogen, UA: 0.2 mg/dL (ref 0.0–1.0)
pH: 6 (ref 5.0–8.0)

## 2021-05-17 LAB — POCT PREGNANCY, URINE: Preg Test, Ur: NEGATIVE

## 2021-05-17 MED ORDER — MEDROXYPROGESTERONE ACETATE 150 MG/ML IM SUSP
150.0000 mg | Freq: Once | INTRAMUSCULAR | Status: AC
  Administered 2021-05-17: 150 mg via INTRAMUSCULAR

## 2021-05-17 NOTE — Progress Notes (Signed)
[name] here for restart of Depo-Provera for contraception. UPT today is negative. Pt denies intercourse within past 2 weeks. Injection administered without complication.   Patient reports lower abdominal pain. States she has this discomfort every time she has a UTI. Denies other s/s of UTI. UA today is abnormal; positive for blood and nitrites. Urine sent for culture. Explained to patient she will be contacted with any abnormal results.   Patient will return in 3 months for next injection between 08/02/21 and 08/16/21. Next annual visit due August 2023.   Annabell Howells, RN 05/17/2021  8:52 AM

## 2021-05-20 ENCOUNTER — Other Ambulatory Visit: Payer: Self-pay | Admitting: Family Medicine

## 2021-05-20 LAB — URINE CULTURE

## 2021-05-20 MED ORDER — AMOXICILLIN-POT CLAVULANATE 875-125 MG PO TABS: 1.0000 | ORAL_TABLET | Freq: Two times a day (BID) | ORAL | 0 refills | Status: AC

## 2021-05-30 ENCOUNTER — Telehealth: Payer: Medicaid Other | Admitting: Physician Assistant

## 2021-05-30 DIAGNOSIS — J019 Acute sinusitis, unspecified: Secondary | ICD-10-CM | POA: Diagnosis not present

## 2021-05-30 DIAGNOSIS — B9789 Other viral agents as the cause of diseases classified elsewhere: Secondary | ICD-10-CM | POA: Diagnosis not present

## 2021-05-30 MED ORDER — FLUCONAZOLE 150 MG PO TABS: 150.0000 mg | ORAL_TABLET | Freq: Once | ORAL | 0 refills | Status: AC

## 2021-05-30 MED ORDER — FLUTICASONE PROPIONATE 50 MCG/ACT NA SUSP: 2.0000 | Freq: Every day | NASAL | 0 refills | Status: DC

## 2021-05-30 MED ORDER — BENZONATATE 100 MG PO CAPS: 100.0000 mg | ORAL_CAPSULE | Freq: Three times a day (TID) | ORAL | 0 refills | Status: DC | PRN

## 2021-05-30 NOTE — Patient Instructions (Signed)
Kimberly Macias, thank you for joining Kimberly Rio, PA-C for today's virtual visit.  While this provider is not your primary care provider (PCP), if your PCP is located in our provider database this encounter information will be shared with them immediately following your visit.  Consent: (Patient) Kimberly Macias provided verbal consent for this virtual visit at the beginning of the encounter.  Current Medications:  Current Outpatient Medications:    ibuprofen (ADVIL) 600 MG tablet, Take 1 tablet (600 mg total) by mouth every 6 (six) hours as needed., Disp: 30 tablet, Rfl: 0   Medications ordered in this encounter:  No orders of the defined types were placed in this encounter.    *If you need refills on other medications prior to your next appointment, please contact your pharmacy*  Follow-Up: Call back or seek an in-person evaluation if the symptoms worsen or if the condition fails to improve as anticipated.  Other Instructions  Based on what you have shared with me it looks like you have sinusitis.  Sinusitis is inflammation and infection in the sinus cavities of the head.  Based on your presentation I believe you most likely have Acute Viral Sinusitis.This is an infection most likely caused by a virus. There is not specific treatment for viral sinusitis other than to help you with the symptoms until the infection runs its course.  You may use an oral decongestant such as Mucinex D or if you have glaucoma or high blood pressure use plain Mucinex. Saline nasal spray help and can safely be used as often as needed for congestion, I have prescribed: Fluticasone nasal spray two sprays in each nostril once a day  Some authorities believe that zinc sprays or the use of Echinacea may shorten the course of your symptoms.  Sinus infections are not as easily transmitted as other respiratory infection, however we still recommend that you avoid close contact with loved ones, especially the  very young and elderly.  Remember to wash your hands thoroughly throughout the day as this is the number one way to prevent the spread of infection!  Home Care: Only take medications as instructed by your medical team. Do not take these medications with alcohol. A steam or ultrasonic humidifier can help congestion.  You can place a towel over your head and breathe in the steam from hot water coming from a faucet. Avoid close contacts especially the very young and the elderly. Cover your mouth when you cough or sneeze. Always remember to wash your hands.  Get Help Right Away If: You develop worsening fever or sinus pain. You develop a severe head ache or visual changes. Your symptoms persist after you have completed your treatment plan.  Make sure you Understand these instructions. Will watch your condition. Will get help right away if you are not doing well or get worse.   If you have been instructed to have an in-person evaluation today at a local Urgent Care facility, please use the link below. It will take you to a list of all of our available [facility], including address, phone number and hours of operation. Please do not delay care.  [facility]  If you or a family member do not have a primary care provider, use the link below to schedule a visit and establish care. When you choose a [facility] primary care physician or advanced practice provider, you gain a long-term partner in health. Find a Primary Care Provider  Learn more about  [facility]'s in-office and virtual care options: [facility]

## 2021-05-30 NOTE — Progress Notes (Signed)
Virtual Visit Consent   Kimberly Macias, you are scheduled for a virtual visit with a [facility] provider today.     Just as with appointments in the office, your consent must be obtained to participate.  Your consent will be active for this visit and any virtual visit you may have with one of our providers in the next 365 days.     If you have a MyChart account, a copy of this consent can be sent to you electronically.  All virtual visits are billed to your insurance company just like a traditional visit in the office.    As this is a virtual visit, video technology does not allow for your provider to perform a traditional examination.  This may limit your provider's ability to fully assess your condition.  If your provider identifies any concerns that need to be evaluated in person or the need to arrange testing (such as labs, EKG, etc.), we will make arrangements to do so.     Although advances in technology are sophisticated, we cannot ensure that it will always work on either your end or our end.  If the connection with a video visit is poor, the visit may have to be switched to a telephone visit.  With either a video or telephone visit, we are not always able to ensure that we have a secure connection.     I need to obtain your verbal consent now.   Are you willing to proceed with your visit today?    [name] has provided verbal consent on 05/30/2021 for a virtual visit (video or telephone).   Kimberly Macias, [place]   Date: 05/30/2021 12:29 PM   Virtual Visit via Video Note   I, Kimberly Rio, PA-C, attempted to connect with Kimberly Macias; MRN [id number] on 05/30/21 via [place] to complete a video urgent care visit. The patient was unable to successfully connect to the video platform. As such, the patient was contacted by this provider via phone to complete the encounter.    Location: Patient: Virtual Visit Location Patient: Home Provider: Virtual Visit Location  Provider: Home Office   I discussed the limitations of evaluation and management by telemedicine and the availability of in person appointments. The patient expressed understanding and agreed to proceed.    History of Present Illness: Kimberly Macias is a 41 y.o. who identifies as a female who was assigned female at birth, and is being seen today for URI symptoms starting Monday with vice hoarseness, sore throat with swallowing, headache and nasal congestion. Notes initially having some fever, chills that have resolved. Has taken a COVID test through work and 2 at home since symptom onset, all of which have been negative. Daughter has been sick recently. States symptoms are improving but very slowly so.  HPI: HPI  Problems:  Patient Active Problem List   Diagnosis Date Noted   Encounter for annual routine gynecological examination 09/29/2019   Abnormal urine odor 09/29/2019   Change in skin mole 09/29/2019   Syncope 07/07/2017    Allergies: No Known Allergies Medications:  Current Outpatient Medications:    benzonatate (TESSALON) 100 MG capsule, Take 1 capsule (100 mg total) by mouth 3 (three) times daily as needed for cough., Disp: 30 capsule, Rfl: 0   fluconazole (DIFLUCAN) 150 MG tablet, Take 1 tablet (150 mg total) by mouth once for 1 dose., Disp: 1 tablet, Rfl: 0   fluticasone (FLONASE) 50 MCG/ACT nasal spray, Place 2 sprays into both nostrils  daily., Disp: 16 g, Rfl: 0   ibuprofen (ADVIL) 600 MG tablet, Take 1 tablet (600 mg total) by mouth every 6 (six) hours as needed., Disp: 30 tablet, Rfl: 0  Observations/Objective: Patient is well-developed, well-nourished in no acute distress.  Resting comfortably at home.  Head is normocephalic, atraumatic.  No labored breathing. Speech is clear and coherent with logical content.  Patient is alert and oriented at baseline.   Assessment and Plan: 1. Acute viral sinusitis - fluticasone (FLONASE) 50 MCG/ACT nasal spray; Place 2 sprays into  both nostrils daily.  Dispense: 16 g; Refill: 0 - benzonatate (TESSALON) 100 MG capsule; Take 1 capsule (100 mg total) by mouth 3 (three) times daily as needed for cough.  Dispense: 30 capsule; Refill: 0  Multiple negative COVID tests. Symptoms slowly improving. Ok to return to work giving negative tests and no fever for > 48 hours. Supportive measures, OTC medications and Vitamin recommendations reviewed. Rx Flonase and Tessalon.  Follow Up Instructions: I discussed the assessment and treatment plan with the patient. The patient was provided an opportunity to ask questions and all were answered. The patient agreed with the plan and demonstrated an understanding of the instructions.  A copy of instructions were sent to the patient via MyChart unless otherwise noted below.   The patient was advised to call back or seek an in-person evaluation if the symptoms worsen or if the condition fails to improve as anticipated.  Time:  I spent 10 minutes with the patient via telehealth technology discussing the above problems/concerns.    Kimberly Rio, PA-C

## 2021-06-05 ENCOUNTER — Telehealth: Payer: Medicaid Other | Admitting: Family

## 2021-06-05 DIAGNOSIS — J019 Acute sinusitis, unspecified: Secondary | ICD-10-CM

## 2021-06-05 MED ORDER — AMOXICILLIN-POT CLAVULANATE 875-125 MG PO TABS: 1.0000 | ORAL_TABLET | Freq: Two times a day (BID) | ORAL | 0 refills | Status: DC

## 2021-06-05 NOTE — Patient Instructions (Signed)

## 2021-06-05 NOTE — Progress Notes (Signed)
Virtual Visit Consent   Kimberly Macias, you are scheduled for a virtual visit with a [facility] provider today.     Just as with appointments in the office, your consent must be obtained to participate.  Your consent will be active for this visit and any virtual visit you may have with one of our providers in the next 365 days.     If you have a MyChart account, a copy of this consent can be sent to you electronically.  All virtual visits are billed to your insurance company just like a traditional visit in the office.    As this is a virtual visit, video technology does not allow for your provider to perform a traditional examination.  This may limit your provider's ability to fully assess your condition.  If your provider identifies any concerns that need to be evaluated in person or the need to arrange testing (such as labs, EKG, etc.), we will make arrangements to do so.     Although advances in technology are sophisticated, we cannot ensure that it will always work on either your end or our end.  If the connection with a video visit is poor, the visit may have to be switched to a telephone visit.  With either a video or telephone visit, we are not always able to ensure that we have a secure connection.     I need to obtain your verbal consent now.   Are you willing to proceed with your visit today?    Kimberly Macias has provided verbal consent on 06/05/2021 for a virtual visit (video or telephone).   Evelina Dun, FNP   Date: 06/05/2021 10:20 AM   Virtual Visit via Video Note   I, Evelina Dun, connected with  Kimberly Macias  ([id number], [birth year]) on 06/05/21 at 10:00 AM EST by a video-enabled telemedicine application and verified that I am speaking with the correct person using two identifiers.  Location: Patient: Virtual Visit Location Patient: Home Provider: Virtual Visit Location Provider: Home Office   I discussed the limitations of evaluation and management by  telemedicine and the availability of in person appointments. The patient expressed understanding and agreed to proceed.    History of Present Illness: Kimberly Macias is a 41 y.o. who identifies as a female who was assigned female at birth, and is being seen today for headache and sinus issues. She has done a home COVID test that was negative.   HPI: Sinusitis This is a new problem. The current episode started in the past 7 days. The problem has been gradually worsening since onset. There has been no fever. Her pain is at a severity of 8/10. The pain is moderate. Associated symptoms include chills, congestion, coughing, headaches, a hoarse voice, sinus pressure, sneezing and a sore throat. Pertinent negatives include no ear pain or shortness of breath. Past treatments include oral decongestants. The treatment provided mild relief.   Problems:  Patient Active Problem List   Diagnosis Date Noted   Encounter for annual routine gynecological examination 09/29/2019   Abnormal urine odor 09/29/2019   Change in skin mole 09/29/2019   Syncope 07/07/2017    Allergies: No Known Allergies Medications:  Current Outpatient Medications:    amoxicillin-clavulanate (AUGMENTIN) 875-125 MG tablet, Take 1 tablet by mouth 2 (two) times daily., Disp: 14 tablet, Rfl: 0   benzonatate (TESSALON) 100 MG capsule, Take 1 capsule (100 mg total) by mouth 3 (three) times daily as needed for cough., Disp: 30 capsule, Rfl: 0  fluticasone (FLONASE) 50 MCG/ACT nasal spray, Place 2 sprays into both nostrils daily., Disp: 16 g, Rfl: 0   ibuprofen (ADVIL) 600 MG tablet, Take 1 tablet (600 mg total) by mouth every 6 (six) hours as needed., Disp: 30 tablet, Rfl: 0  Observations/Objective: Patient is well-developed, well-nourished in no acute distress.  Resting comfortably  at home.  Head is normocephalic, atraumatic.  No labored breathing.  Speech is clear and coherent with logical content.  Patient is alert and oriented at  baseline.  Nasal congestion  Assessment and Plan: 1. Acute sinusitis, recurrence not specified, unspecified location - amoxicillin-clavulanate (AUGMENTIN) 875-125 MG tablet; Take 1 tablet by mouth 2 (two) times daily.  Dispense: 14 tablet; Refill: 0  - Take meds as prescribed - Use a cool mist humidifier  -Use saline nose sprays frequently -Force fluids -For any cough or congestion  Use plain Mucinex- regular strength or max strength is fine -For fever or aces or pains- take tylenol or ibuprofen. -Throat lozenges if help   Follow Up Instructions: I discussed the assessment and treatment plan with the patient. The patient was provided an opportunity to ask questions and all were answered. The patient agreed with the plan and demonstrated an understanding of the instructions.  A copy of instructions were sent to the patient via MyChart unless otherwise noted below.     The patient was advised to call back or seek an in-person evaluation if the symptoms worsen or if the condition fails to improve as anticipated.  Time:  I spent 13 minutes with the patient via telehealth technology discussing the above problems/concerns.    Evelina Dun, FNP

## 2021-08-02 ENCOUNTER — Encounter: Payer: Self-pay | Admitting: Family Medicine

## 2021-08-02 ENCOUNTER — Ambulatory Visit (INDEPENDENT_AMBULATORY_CARE_PROVIDER_SITE_OTHER): Payer: Medicaid Other | Admitting: *Deleted

## 2021-08-02 VITALS — BP 120/76 | HR 76 | Ht 63.5 in | Wt 154.2 lb

## 2021-08-02 DIAGNOSIS — Z3042 Encounter for surveillance of injectable contraceptive: Secondary | ICD-10-CM

## 2021-08-02 MED ORDER — MEDROXYPROGESTERONE ACETATE 150 MG/ML IM SUSP
150.0000 mg | Freq: Once | INTRAMUSCULAR | Status: AC
  Administered 2021-08-02: 150 mg via INTRAMUSCULAR

## 2021-08-02 NOTE — Progress Notes (Signed)
Depo Provera 150 mg IM administered as scheduled.  Pt tolerated well. Next injection due 6/30-7/14.  Annual exam due August 2023. Pt voiced understanding.  ?

## 2021-09-27 ENCOUNTER — Encounter: Payer: Self-pay | Admitting: Obstetrics & Gynecology

## 2021-09-27 ENCOUNTER — Ambulatory Visit (INDEPENDENT_AMBULATORY_CARE_PROVIDER_SITE_OTHER): Payer: Medicaid Other | Admitting: Obstetrics & Gynecology

## 2021-09-27 VITALS — BP 123/78 | HR 64 | Wt 155.6 lb

## 2021-09-27 DIAGNOSIS — N951 Menopausal and female climacteric states: Secondary | ICD-10-CM

## 2021-09-27 DIAGNOSIS — Z3042 Encounter for surveillance of injectable contraceptive: Secondary | ICD-10-CM

## 2021-09-27 NOTE — Progress Notes (Signed)
Patient ID: Kimberly Macias, female   DOB: 1981-03-30, 41 y.o.   MRN: [id number]  Chief Complaint  Patient presents with   Menopause    HPI Kimberly Macias is a 41 y.o. female.  [id number] No LMP recorded. Patient has had an injection. She has used DMPA for 4 years with no withdrawal bleeding. She has VMS for a year with sweats and hot flushes. She requests hormonal testing for menopause HPI  Past Medical History:  Diagnosis Date   Anemia    Gestational diabetes 2010   giet controlled    Past Surgical History:  Procedure Laterality Date   DIAGNOSTIC LAPAROSCOPY WITH REMOVAL OF ECTOPIC PREGNANCY N/A 07/05/2016   Procedure: DIAGNOSTIC LAPAROSCOPY WITH REMOVAL OF ECTOPIC PREGNANCY;  Surgeon: [name], MD;  Location: [facility];  Service: Gynecology;  Laterality: N/A;   LAPAROSCOPIC UNILATERAL SALPINGECTOMY Left 07/05/2016   Procedure: LAPAROSCOPIC UNILATERAL SALPINGECTOMY;  Surgeon: [name], MD;  Location: [facility];  Service: Gynecology;  Laterality: Left;    No family history on file.  Social History Social History   Tobacco Use   Smoking status: Never   Smokeless tobacco: Never  Vaping Use   Vaping Use: Never used  Substance Use Topics   Alcohol use: Yes    Comment: occasional   Drug use: No    No Known Allergies  No current outpatient medications on file.   No current facility-administered medications for this visit.    Review of Systems Review of Systems  Constitutional:        VMS  HENT: Negative.    Respiratory: Negative.    Cardiovascular: Negative.   Gastrointestinal: Negative.   Genitourinary:  Negative for pelvic pain and vaginal bleeding.    Blood pressure 123/78, pulse 64, weight 155 lb 9.6 oz (70.6 kg).  Physical Exam Physical Exam Vitals and nursing note reviewed.  Constitutional:      Appearance: Normal appearance.  Cardiovascular:     Rate and Rhythm: Normal rate.  Pulmonary:     Effort: Pulmonary effort is normal.  Neurological:      Mental Status: She is alert.  Psychiatric:        Mood and Affect: Mood normal.        Behavior: Behavior normal.     Data Reviewed Pap is up to date 11/2020  Assessment Long-term DMPA use may have sx of estrogen depletion as a result but will check hormonal status  Plan Consider alternative contraceptive to be able to d/c DMPA Orders Placed This Encounter  Procedures   TSH Rfx on Abnormal to Free T4    Order Specific Question:   Release to patient    Answer:   Immediate   FSH   Estrogens, Total   Review sx in 6 mo Information on LARC given    Kimberly Macias 09/27/2021, 10:34 AM

## 2021-10-03 ENCOUNTER — Encounter: Payer: Self-pay | Admitting: Obstetrics & Gynecology

## 2021-10-03 LAB — TSH RFX ON ABNORMAL TO FREE T4: TSH: 1.08 u[IU]/mL (ref 0.450–4.500)

## 2021-10-03 LAB — FOLLICLE STIMULATING HORMONE: FSH: 10.2 m[IU]/mL

## 2021-10-03 LAB — ESTROGENS, TOTAL: Estrogen: 53 pg/mL

## 2021-10-04 ENCOUNTER — Encounter: Payer: Self-pay | Admitting: *Deleted

## 2021-10-18 ENCOUNTER — Ambulatory Visit (INDEPENDENT_AMBULATORY_CARE_PROVIDER_SITE_OTHER): Payer: Medicaid Other

## 2021-10-18 ENCOUNTER — Other Ambulatory Visit: Payer: Self-pay

## 2021-10-18 VITALS — BP 131/84 | HR 69 | Wt 155.5 lb

## 2021-10-18 DIAGNOSIS — Z3042 Encounter for surveillance of injectable contraceptive: Secondary | ICD-10-CM

## 2021-10-18 MED ORDER — MEDROXYPROGESTERONE ACETATE 150 MG/ML IM SUSP
150.0000 mg | Freq: Once | INTRAMUSCULAR | Status: AC
  Administered 2021-10-18: 150 mg via INTRAMUSCULAR

## 2021-10-18 NOTE — Patient Instructions (Signed)
[email or web address]

## 2021-10-18 NOTE — Progress Notes (Signed)
[name] here for Depo-Provera Injection. Injection administered without complication. Patient will return in 3 months for next injection between 01/03/22 and 01/17/22. Next annual visit due June 2024.   Patient states she was told by Roselie Awkward, MD that she should consider new form of birth control due to long term use of Depo Provera. Patient given information on other forms of birth control and will call back to make appt if desired.  Annabell Howells, RN 10/18/2021  8:29 AM

## 2021-12-02 ENCOUNTER — Emergency Department (HOSPITAL_COMMUNITY)
Admission: EM | Admit: 2021-12-02 | Discharge: 2021-12-02 | Disposition: A | Discharge status: Home / Self Care | Payer: Medicaid Other | Attending: Emergency Medicine | Admitting: Emergency Medicine

## 2021-12-02 ENCOUNTER — Other Ambulatory Visit: Payer: Self-pay

## 2021-12-02 ENCOUNTER — Emergency Department (HOSPITAL_COMMUNITY): Payer: Medicaid Other

## 2021-12-02 ENCOUNTER — Encounter (HOSPITAL_COMMUNITY): Payer: Self-pay | Admitting: Emergency Medicine

## 2021-12-02 DIAGNOSIS — Z20822 Contact with and (suspected) exposure to covid-19: Secondary | ICD-10-CM | POA: Insufficient documentation

## 2021-12-02 DIAGNOSIS — R5381 Other malaise: Secondary | ICD-10-CM | POA: Diagnosis not present

## 2021-12-02 DIAGNOSIS — R079 Chest pain, unspecified: Secondary | ICD-10-CM

## 2021-12-02 DIAGNOSIS — R6884 Jaw pain: Secondary | ICD-10-CM | POA: Insufficient documentation

## 2021-12-02 DIAGNOSIS — N9489 Other specified conditions associated with female genital organs and menstrual cycle: Secondary | ICD-10-CM | POA: Diagnosis not present

## 2021-12-02 DIAGNOSIS — R059 Cough, unspecified: Secondary | ICD-10-CM | POA: Diagnosis not present

## 2021-12-02 DIAGNOSIS — R0789 Other chest pain: Secondary | ICD-10-CM | POA: Insufficient documentation

## 2021-12-02 DIAGNOSIS — B349 Viral infection, unspecified: Secondary | ICD-10-CM

## 2021-12-02 LAB — BASIC METABOLIC PANEL
Anion gap: 5 (ref 5–15)
BUN: 12 mg/dL (ref 6–20)
CO2: 22 mmol/L (ref 22–32)
Calcium: 9.6 mg/dL (ref 8.9–10.3)
Chloride: 111 mmol/L (ref 98–111)
Creatinine, Ser: 0.85 mg/dL (ref 0.44–1.00)
GFR, Estimated: >60 mL/min (ref >60)
Glucose, Bld: 108 mg/dL — ABNORMAL HIGH (ref 70–99)
Potassium: 3.9 mmol/L (ref 3.5–5.1)
Sodium: 138 mmol/L (ref 135–145)

## 2021-12-02 LAB — RESP PANEL BY RT-PCR (FLU A&B, COVID) ARPGX2
Influenza A by PCR: NEGATIVE
Influenza B by PCR: NEGATIVE
SARS Coronavirus 2 by RT PCR: NEGATIVE

## 2021-12-02 LAB — CBC
HCT: 38.3 % (ref 36.0–46.0)
Hemoglobin: 12.9 g/dL (ref 12.0–15.0)
MCH: 32.8 pg (ref 26.0–34.0)
MCHC: 33.7 g/dL (ref 30.0–36.0)
MCV: 97.5 fL (ref 80.0–100.0)
Platelets: 266 10*3/uL (ref 150–400)
RBC: 3.93 MIL/uL (ref 3.87–5.11)
RDW: 13.1 % (ref 11.5–15.5)
WBC: 4.1 10*3/uL (ref 4.0–10.5)
nRBC: 0 % (ref 0.0–0.2)

## 2021-12-02 LAB — I-STAT BETA HCG BLOOD, ED (MC, WL, AP ONLY): I-stat hCG, quantitative: <5 m[IU]/mL (ref <5)

## 2021-12-02 LAB — TROPONIN I (HIGH SENSITIVITY): Troponin I (High Sensitivity): <2 ng/L (ref <18)

## 2021-12-02 NOTE — ED Triage Notes (Signed)
Pt reports central chest pain that began yesterday. Pt reports pain in her jaw as well. Denies N/v

## 2021-12-02 NOTE — Discharge Instructions (Signed)
The chest pain you are having, may be related to gastroesophageal reflux.  To treat that we recommend using Maalox 2 tablespoons before meals and bedtime.  We will refer you to a GI doctor if these type symptoms persist.  Your other symptoms seem more like a virus than anything else.  There is no sign of serious infection on the x-ray, EKG or blood testing.  To treat the other symptoms, use Tylenol for headache, Robitussin-DM for cough, drink plenty of fluids and rest.  Hopefully your symptoms will improve in the next few days.

## 2021-12-02 NOTE — ED Provider Notes (Signed)
[facility] DEPT Provider Note   CSN: [id number] Arrival date & time: 12/02/21  0741     History  Chief Complaint  Patient presents with   Chest Pain    Kimberly Macias is a 41 y.o. female.  HPI She presents for evaluation of malaise, chest discomfort, cough with sputum production, achiness and jaw, arms and back.  Symptoms present for 2 days.  Recurrent symptoms previously diagnosed as reflux."  She is currently employed.  She has not had COVID vaccines.  No known sick exposures.  She currently works for [employer].     Home Medications Prior to Admission medications   Not on File      Allergies    Patient has no known allergies.    Review of Systems   Review of Systems  Physical Exam Updated Vital Signs BP 101/68   Pulse 64   Temp 98.3 F (36.8 C) (Oral)   Resp 13   Ht '5\' 3"'$  (1.6 m)   Wt 71 kg   SpO2 100%   BMI 27.73 kg/m  Physical Exam Vitals and nursing note reviewed.  Constitutional:      Appearance: She is well-developed. She is not ill-appearing.  HENT:     Head: Normocephalic and atraumatic.     Right Ear: External ear normal.     Left Ear: External ear normal.     Nose: No congestion.     Mouth/Throat:     Pharynx: No oropharyngeal exudate.  Eyes:     Conjunctiva/sclera: Conjunctivae normal.     Pupils: Pupils are equal, round, and reactive to light.  Neck:     Trachea: Phonation normal.  Cardiovascular:     Rate and Rhythm: Normal rate and regular rhythm.     Heart sounds: Normal heart sounds.  Pulmonary:     Effort: Pulmonary effort is normal.     Breath sounds: Normal breath sounds.  Abdominal:     General: There is no distension.     Palpations: Abdomen is soft.     Tenderness: There is no abdominal tenderness.  Musculoskeletal:        General: Normal range of motion.     Cervical back: Normal range of motion and neck supple.  Skin:    General: Skin is warm and dry.  Neurological:     Mental  Status: She is alert and oriented to person, place, and time.     Cranial Nerves: No cranial nerve deficit.     Sensory: No sensory deficit.     Motor: No abnormal muscle tone.     Coordination: Coordination normal.  Psychiatric:        Mood and Affect: Mood normal.        Behavior: Behavior normal.        Thought Content: Thought content normal.        Judgment: Judgment normal.     ED Results / Procedures / Treatments   Labs (all labs ordered are listed, but only abnormal results are displayed) Labs Reviewed  BASIC METABOLIC PANEL - Abnormal; Notable for the following components:      Result Value   Glucose, Bld 108 (*)    All other components within normal limits  RESP PANEL BY RT-PCR (FLU A&B, COVID) [id number]  CBC  I-STAT BETA HCG BLOOD, ED ([facility], [facility], [facility] ONLY)  TROPONIN I (HIGH SENSITIVITY)  TROPONIN I (HIGH SENSITIVITY)    EKG EKG Interpretation  Date/Time:  Monday December 02 2021  07:49:24 EDT Ventricular Rate:  61 PR Interval:  154 QRS Duration: 69 QT Interval:  413 QTC Calculation: 416 R Axis:   79 Text Interpretation: Sinus rhythm Right atrial enlargement since last tracing no significant change Confirmed by Daleen Bo [phone]) on 12/02/2021 7:56:34 AM  Radiology DG Chest 2 View  Result Date: 12/02/2021 CLINICAL DATA:  Chest pain EXAM: CHEST - 2 VIEW COMPARISON:  Chest x-ray dated November 25, 2020 FINDINGS: The heart size and mediastinal contours are within normal limits. Both lungs are clear. The visualized skeletal structures are unremarkable. IMPRESSION: No active cardiopulmonary disease. Electronically Signed   By: Yetta Glassman M.D.   On: 12/02/2021 08:25    Procedures Procedures    Medications Ordered in ED Medications - No data to display  ED Course/ Medical Decision Making/ A&P                           Medical Decision Making She presents for evaluation of nonspecific chest pain.  Low risk for cardiac disease.  She reports prior symptoms similar  when she had reflux.  Amount and/or Complexity of Data Reviewed Independent Historian:     Details: She is cogent historian Labs: ordered.    Details: CBC, metabolic panel, pregnancy test, troponin -- normal Radiology: ordered and independent interpretation performed.    Details: Chest x-ray -- no infiltrate or edema Discussion of management or test interpretation with external provider(s): Patient evaluated for ACS and pulmonary disorders.  No evidence for viral illness.  Doubt CHF, ACS, PE or pneumonia.  Reassuring vital signs, without oxygen requirement.  Various problems can cause this type of symptom, and symptomatic care is indicated.  She does not require hospitalization at this time.  Discharge instructions given.           Final Clinical Impression(s) / ED Diagnoses Final diagnoses:  None    Rx / DC Orders ED Discharge Orders     None         Daleen Bo, MD 12/02/21 1108

## 2022-01-03 ENCOUNTER — Ambulatory Visit (INDEPENDENT_AMBULATORY_CARE_PROVIDER_SITE_OTHER): Payer: Medicaid Other | Admitting: *Deleted

## 2022-01-03 VITALS — BP 117/73 | HR 68 | Ht 63.5 in | Wt 156.3 lb

## 2022-01-03 DIAGNOSIS — Z3042 Encounter for surveillance of injectable contraceptive: Secondary | ICD-10-CM | POA: Diagnosis not present

## 2022-01-03 MED ORDER — MEDROXYPROGESTERONE ACETATE 150 MG/ML IM SUSP
150.0000 mg | Freq: Once | INTRAMUSCULAR | Status: AC
  Administered 2022-01-03: 150 mg via INTRAMUSCULAR

## 2022-01-03 NOTE — Progress Notes (Signed)
Depo provera 150 mg administered IM as scheduled.  Pt tolerated well. Next injection due 12/1-12/15. Last Pap 12/04/20 - normal.  Next Annual exam due June 2024.

## 2022-01-30 ENCOUNTER — Telehealth: Payer: Medicaid Other | Admitting: Family Medicine

## 2022-01-30 DIAGNOSIS — T3695XA Adverse effect of unspecified systemic antibiotic, initial encounter: Secondary | ICD-10-CM | POA: Diagnosis not present

## 2022-01-30 DIAGNOSIS — R3989 Other symptoms and signs involving the genitourinary system: Secondary | ICD-10-CM

## 2022-01-30 DIAGNOSIS — B379 Candidiasis, unspecified: Secondary | ICD-10-CM

## 2022-01-30 MED ORDER — FLUCONAZOLE 150 MG PO TABS: 150.0000 mg | ORAL_TABLET | Freq: Once | ORAL | 0 refills | Status: AC

## 2022-01-30 MED ORDER — NITROFURANTOIN MONOHYD MACRO 100 MG PO CAPS: 100.0000 mg | ORAL_CAPSULE | Freq: Two times a day (BID) | ORAL | 0 refills | Status: AC

## 2022-01-30 NOTE — Progress Notes (Signed)
Virtual Visit Consent   [name], you are scheduled for a virtual visit with a [facility] provider today. Just as with appointments in the office, your consent must be obtained to participate. Your consent will be active for this visit and any virtual visit you may have with one of our providers in the next 365 days. If you have a MyChart account, a copy of this consent can be sent to you electronically.  As this is a virtual visit, video technology does not allow for your provider to perform a traditional examination. This may limit your provider's ability to fully assess your condition. If your provider identifies any concerns that need to be evaluated in person or the need to arrange testing (such as labs, EKG, etc.), we will make arrangements to do so. Although advances in technology are sophisticated, we cannot ensure that it will always work on either your end or our end. If the connection with a video visit is poor, the visit may have to be switched to a telephone visit. With either a video or telephone visit, we are not always able to ensure that we have a secure connection.  By engaging in this virtual visit, you consent to the provision of healthcare and authorize for your insurance to be billed (if applicable) for the services provided during this visit. Depending on your insurance coverage, you may receive a charge related to this service.  I need to obtain your verbal consent now. Are you willing to proceed with your visit today? [name] has provided verbal consent on 01/30/2022 for a virtual visit (video or telephone). [name], NP  Date: 01/30/2022 11:50 AM  Virtual Visit via Video Note   I, [name], connected with  [name]  ([id number], 08/20/1980) on 01/30/22 at 12:45 PM EDT by a video-enabled telemedicine application and verified that I am speaking with the correct person using two identifiers.  Location: Patient: Virtual Visit Location Patient:  Home Provider: Virtual Visit Location Provider: Home Office   I discussed the limitations of evaluation and management by telemedicine and the availability of in person appointments. The patient expressed understanding and agreed to proceed.    History of Present Illness: Kimberly Macias is a 41 y.o. who identifies as a female who was assigned female at birth, and is being seen today for UTI  HPI: Urinary Tract Infection  This is a new problem. The current episode started in the past 7 days (Monday/Tuesday felt like it was starting, decided to drink alot of water). The problem occurs every urination. The problem has been gradually worsening. The quality of the pain is described as burning (pressure). The pain is at a severity of 6/10. The pain is moderate. There has been no fever. She is Sexually active. There is No history of pyelonephritis. Associated symptoms include frequency, hesitancy and urgency. Pertinent negatives include no chills, discharge, flank pain, hematuria, nausea, possible pregnancy, sweats or vomiting. She has tried acetaminophen and increased fluids for the symptoms. The treatment provided no relief.    Problems:  Patient Active Problem List   Diagnosis Date Noted   Encounter for annual routine gynecological examination 09/29/2019   Abnormal urine odor 09/29/2019   Change in skin mole 09/29/2019   Syncope 07/07/2017    Allergies: No Known Allergies Medications: No current outpatient medications on file.  Observations/Objective: Patient is well-developed, well-nourished in no acute distress.  Resting comfortably  at home.  Head is normocephalic, atraumatic.  No labored breathing.  Speech is clear and coherent with logical content.  Patient is alert and oriented at baseline.    Assessment and Plan:  1. Suspected UTI  - nitrofurantoin, macrocrystal-monohydrate, (MACROBID) 100 MG capsule; Take 1 capsule (100 mg total) by mouth 2 (two) times daily for 5 days.   Dispense: 10 capsule; Refill: 0  2. Antibiotic-induced yeast infection  - fluconazole (DIFLUCAN) 150 MG tablet; Take 1 tablet (150 mg total) by mouth once for 1 dose.  Dispense: 1 tablet; Refill: 0  -hydrate -good hygiene reviewed and on avs -take meds a prescribed -in person needs if not improved or worsening symptoms -no red flags or kidney concern today   Reviewed side effects, risks and benefits of medication.     Patient acknowledged agreement and understanding of the plan.   Past Medical, Surgical, Social History, Allergies, and Medications have been Reviewed.   Follow Up Instructions: I discussed the assessment and treatment plan with the patient. The patient was provided an opportunity to ask questions and all were answered. The patient agreed with the plan and demonstrated an understanding of the instructions.  A copy of instructions were sent to the patient via MyChart unless otherwise noted below.     The patient was advised to call back or seek an in-person evaluation if the symptoms worsen or if the condition fails to improve as anticipated.  Time:  I spent 7 minutes with the patient via telehealth technology discussing the above problems/concerns.    [name], NP

## 2022-01-30 NOTE — Patient Instructions (Signed)
[name], thank you for joining [name], NP for today's virtual visit.  While this provider is not your primary care provider (PCP), if your PCP is located in our provider database this encounter information will be shared with them immediately following your visit.  Consent: (Patient) Kimberly Macias provided verbal consent for this virtual visit at the beginning of the encounter.  Current Medications:  Current Outpatient Medications:    fluconazole (DIFLUCAN) 150 MG tablet, Take 1 tablet (150 mg total) by mouth once for 1 dose., Disp: 1 tablet, Rfl: 0   nitrofurantoin, macrocrystal-monohydrate, (MACROBID) 100 MG capsule, Take 1 capsule (100 mg total) by mouth 2 (two) times daily for 5 days., Disp: 10 capsule, Rfl: 0   Medications ordered in this encounter:  Meds ordered this encounter  Medications   nitrofurantoin, macrocrystal-monohydrate, (MACROBID) 100 MG capsule    Sig: Take 1 capsule (100 mg total) by mouth 2 (two) times daily for 5 days.    Dispense:  10 capsule    Refill:  0    Order Specific Question:   Supervising Provider    Answer:   Chase Picket [[id number]]   fluconazole (DIFLUCAN) 150 MG tablet    Sig: Take 1 tablet (150 mg total) by mouth once for 1 dose.    Dispense:  1 tablet    Refill:  0    Order Specific Question:   Supervising Provider    Answer:   Chase Picket [id number]     *If you need refills on other medications prior to your next appointment, please contact your pharmacy*  Follow-Up: Call back or seek an in-person evaluation if the symptoms worsen or if the condition fails to improve as anticipated.  [facility] [phone]  Other Instructions  -hydrate well- take one cup of water with each dose of medication -practice good hygiene to help prevent UTI's  Urinary Tract Infection, Adult A urinary tract infection (UTI) is an infection of any part of the urinary tract. The urinary tract includes: The kidneys. The  ureters. The bladder. The urethra. These organs make, store, and get rid of pee (urine) in the body. What are the causes? This infection is caused by germs (bacteria) in your genital area. These germs grow and cause swelling (inflammation) of your urinary tract. What increases the risk? The following factors may make you more likely to develop this condition: Using a small, thin tube (catheter) to drain pee. Not being able to control when you pee or poop (incontinence). Being female. If you are female, these things can increase the risk: Using these methods to prevent pregnancy: A medicine that kills sperm (spermicide). A device that blocks sperm (diaphragm). Having low levels of a female hormone (estrogen). Being pregnant. You are more likely to develop this condition if: You have genes that add to your risk. You are sexually active. You take antibiotic medicines. You have trouble peeing because of: A prostate that is bigger than normal, if you are female. A blockage in the part of your body that drains pee from the bladder. A kidney stone. A nerve condition that affects your bladder. Not getting enough to drink. Not peeing often enough. You have other conditions, such as: Diabetes. A weak disease-fighting system (immune system). Sickle cell disease. Gout. Injury of the spine. What are the signs or symptoms? Symptoms of this condition include: Needing to pee right away. Peeing small amounts often. Pain or burning when peeing. Blood in the pee. Pee  that smells bad or not like normal. Trouble peeing. Pee that is cloudy. Fluid coming from the vagina, if you are female. Pain in the belly or lower back. Other symptoms include: Vomiting. Not feeling hungry. Feeling mixed up (confused). This may be the first symptom in older adults. Being tired and grouchy (irritable). A fever. Watery poop (diarrhea). How is this treated? Taking antibiotic medicine. Taking other  medicines. Drinking enough water. In some cases, you may need to see a specialist. Follow these instructions at home:  Medicines Take over-the-counter and prescription medicines only as told by your doctor. If you were prescribed an antibiotic medicine, take it as told by your doctor. Do not stop taking it even if you start to feel better. General instructions Make sure you: Pee until your bladder is empty. Do not hold pee for a long time. Empty your bladder after sex. Wipe from front to back after peeing or pooping if you are a female. Use each tissue one time when you wipe. Drink enough fluid to keep your pee pale yellow. Keep all follow-up visits. Contact a doctor if: You do not get better after 1-2 days. Your symptoms go away and then come back. Get help right away if: You have very bad back pain. You have very bad pain in your lower belly. You have a fever. You have chills. You feeling like you will vomit or you vomit. Summary A urinary tract infection (UTI) is an infection of any part of the urinary tract. This condition is caused by germs in your genital area. There are many risk factors for a UTI. Treatment includes antibiotic medicines. Drink enough fluid to keep your pee pale yellow. This information is not intended to replace advice given to you by your health care provider. Make sure you discuss any questions you have with your health care provider. Document Revised: 11/18/2019 Document Reviewed: 11/18/2019 Elsevier Patient Education  [employer].    If you have been instructed to have an in-person evaluation today at a local Urgent Care facility, please use the link below. It will take you to a list of all of our available [facility], including address, phone number and hours of operation. Please do not delay care.  [facility]  If you or a family member do not have a primary care provider, use the link below to schedule a visit  and establish care. When you choose a [facility] primary care physician or advanced practice provider, you gain a long-term partner in health. Find a Primary Care Provider  Learn more about [facility]'s in-office and virtual care options: [facility]

## 2022-02-18 ENCOUNTER — Emergency Department (HOSPITAL_BASED_OUTPATIENT_CLINIC_OR_DEPARTMENT_OTHER): Payer: Medicaid Other

## 2022-02-18 ENCOUNTER — Telehealth: Payer: Medicaid Other | Admitting: Family Medicine

## 2022-02-18 ENCOUNTER — Emergency Department (HOSPITAL_BASED_OUTPATIENT_CLINIC_OR_DEPARTMENT_OTHER)
Admission: EM | Admit: 2022-02-18 | Discharge: 2022-02-18 | Disposition: A | Discharge status: Home / Self Care | Payer: Medicaid Other | Attending: Emergency Medicine | Admitting: Emergency Medicine

## 2022-02-18 ENCOUNTER — Encounter (HOSPITAL_BASED_OUTPATIENT_CLINIC_OR_DEPARTMENT_OTHER): Payer: Self-pay | Admitting: Emergency Medicine

## 2022-02-18 DIAGNOSIS — R350 Frequency of micturition: Secondary | ICD-10-CM | POA: Diagnosis not present

## 2022-02-18 DIAGNOSIS — Z20822 Contact with and (suspected) exposure to covid-19: Secondary | ICD-10-CM | POA: Diagnosis not present

## 2022-02-18 DIAGNOSIS — R1030 Lower abdominal pain, unspecified: Secondary | ICD-10-CM | POA: Insufficient documentation

## 2022-02-18 DIAGNOSIS — R110 Nausea: Secondary | ICD-10-CM

## 2022-02-18 LAB — CBC
HCT: 42.3 % (ref 36.0–46.0)
Hemoglobin: 13.9 g/dL (ref 12.0–15.0)
MCH: 31 pg (ref 26.0–34.0)
MCHC: 32.9 g/dL (ref 30.0–36.0)
MCV: 94.4 fL (ref 80.0–100.0)
Platelets: 285 10*3/uL (ref 150–400)
RBC: 4.48 MIL/uL (ref 3.87–5.11)
RDW: 13 % (ref 11.5–15.5)
WBC: 3.7 10*3/uL — ABNORMAL LOW (ref 4.0–10.5)
nRBC: 0 % (ref 0.0–0.2)

## 2022-02-18 LAB — URINALYSIS, ROUTINE W REFLEX MICROSCOPIC
Bilirubin Urine: NEGATIVE
Glucose, UA: NEGATIVE mg/dL
Hgb urine dipstick: NEGATIVE
Ketones, ur: NEGATIVE mg/dL
Leukocytes,Ua: NEGATIVE
Nitrite: NEGATIVE
Protein, ur: NEGATIVE mg/dL
Specific Gravity, Urine: 1.021 (ref 1.005–1.030)
pH: 6.5 (ref 5.0–8.0)

## 2022-02-18 LAB — RESP PANEL BY RT-PCR (FLU A&B, COVID) ARPGX2
Influenza A by PCR: NEGATIVE
Influenza B by PCR: NEGATIVE
SARS Coronavirus 2 by RT PCR: NEGATIVE

## 2022-02-18 LAB — COMPREHENSIVE METABOLIC PANEL
ALT: 13 U/L (ref 0–44)
AST: 14 U/L — ABNORMAL LOW (ref 15–41)
Albumin: 4.6 g/dL (ref 3.5–5.0)
Alkaline Phosphatase: 59 U/L (ref 38–126)
Anion gap: 7 (ref 5–15)
BUN: 10 mg/dL (ref 6–20)
CO2: 27 mmol/L (ref 22–32)
Calcium: 10 mg/dL (ref 8.9–10.3)
Chloride: 105 mmol/L (ref 98–111)
Creatinine, Ser: 0.83 mg/dL (ref 0.44–1.00)
GFR, Estimated: >60 mL/min (ref >60)
Glucose, Bld: 104 mg/dL — ABNORMAL HIGH (ref 70–99)
Potassium: 3.8 mmol/L (ref 3.5–5.1)
Sodium: 139 mmol/L (ref 135–145)
Total Bilirubin: 1.1 mg/dL (ref 0.3–1.2)
Total Protein: 8 g/dL (ref 6.5–8.1)

## 2022-02-18 LAB — LIPASE, BLOOD: Lipase: 17 U/L (ref 11–51)

## 2022-02-18 LAB — PREGNANCY, URINE: Preg Test, Ur: NEGATIVE

## 2022-02-18 MED ORDER — IBUPROFEN 800 MG PO TABS
800.0000 mg | ORAL_TABLET | Freq: Once | ORAL | Status: AC
  Administered 2022-02-18: 800 mg via ORAL
  Filled 2022-02-18: qty 1

## 2022-02-18 MED ORDER — IOHEXOL 300 MG/ML  SOLN
100.0000 mL | Freq: Once | INTRAMUSCULAR | Status: AC | PRN
  Administered 2022-02-18: 80 mL via INTRAVENOUS

## 2022-02-18 MED ORDER — ONDANSETRON HCL 4 MG/2ML IJ SOLN
4.0000 mg | Freq: Once | INTRAMUSCULAR | Status: AC
  Administered 2022-02-18: 4 mg via INTRAVENOUS
  Filled 2022-02-18: qty 2

## 2022-02-18 NOTE — ED Triage Notes (Signed)
Low abd pain since yesterday. Endorses urinary frequency.

## 2022-02-18 NOTE — Progress Notes (Signed)
[place]   Needs in person to rule out kidney infection.  Has been feeling poorly since UTI treatment several weeks ago- reports being sick since taking UTI meds. Nausea, chills, and headache.  Patient acknowledged agreement and understanding of the plan.

## 2022-02-18 NOTE — ED Provider Notes (Signed)
[facility] EMERGENCY DEPT Provider Note   CSN: [id number] Arrival date & time: 02/18/22  1208     History  Chief Complaint  Patient presents with   Abdominal Pain    Kimberly Macias is a 41 y.o. female.  Patient presents to the emergency department for evaluation of abdominal pain.  Patient reports that she has been having some episodes of urinary frequency, had a virtual visit for this and was prescribed antibiotics.  She has finished the antibiotics.  Since yesterday she has been having persistent pain in the low abdomen/pelvic area.  She denies associated vaginal bleeding, vaginal discharge.  Patient reports that she overall feels poorly, has a headache, feels achy all over.  She would like a COVID test for this.       Home Medications Prior to Admission medications   Not on File      Allergies    Patient has no known allergies.    Review of Systems   Review of Systems  Physical Exam Updated Vital Signs BP (!) 122/106 (BP Location: Right Arm)   Pulse 63   Temp 98.9 F (37.2 C) (Oral)   Resp 19   Ht 5' 3.5" (1.613 m)   Wt 70.8 kg   SpO2 100%   BMI 27.20 kg/m  Physical Exam Vitals and nursing note reviewed. Exam conducted with a chaperone present.  Constitutional:      General: She is not in acute distress.    Appearance: She is well-developed.  HENT:     Head: Normocephalic and atraumatic.     Mouth/Throat:     Mouth: Mucous membranes are moist.  Eyes:     General: Vision grossly intact. Gaze aligned appropriately.     Extraocular Movements: Extraocular movements intact.     Conjunctiva/sclera: Conjunctivae normal.  Cardiovascular:     Rate and Rhythm: Normal rate and regular rhythm.     Pulses: Normal pulses.     Heart sounds: Normal heart sounds, S1 normal and S2 normal. No murmur heard.    No friction rub. No gallop.  Pulmonary:     Effort: Pulmonary effort is normal. No respiratory distress.     Breath sounds: Normal breath  sounds.  Abdominal:     General: Bowel sounds are normal.     Palpations: Abdomen is soft.     Tenderness: There is no abdominal tenderness. There is no guarding or rebound.     Hernia: No hernia is present.  Genitourinary:    General: Normal vulva.     Exam position: Supine.     Vagina: Normal.     Cervix: No cervical motion tenderness.     Uterus: Normal.      Adnexa: Left adnexa normal.       Right: Tenderness present. No mass or fullness.    Musculoskeletal:        General: No swelling.     Cervical back: Full passive range of motion without pain, normal range of motion and neck supple. No spinous process tenderness or muscular tenderness. Normal range of motion.     Right lower leg: No edema.     Left lower leg: No edema.  Skin:    General: Skin is warm and dry.     Capillary Refill: Capillary refill takes less than 2 seconds.     Findings: No ecchymosis, erythema, rash or wound.  Neurological:     General: No focal deficit present.     Mental Status: She is alert  and oriented to person, place, and time.     GCS: GCS eye subscore is 4. GCS verbal subscore is 5. GCS motor subscore is 6.     Cranial Nerves: Cranial nerves 2-12 are intact.     Sensory: Sensation is intact.     Motor: Motor function is intact.     Coordination: Coordination is intact.  Psychiatric:        Attention and Perception: Attention normal.        Mood and Affect: Mood normal.        Speech: Speech normal.        Behavior: Behavior normal.     ED Results / Procedures / Treatments   Labs (all labs ordered are listed, but only abnormal results are displayed) Labs Reviewed  COMPREHENSIVE METABOLIC PANEL - Abnormal; Notable for the following components:      Result Value   Glucose, Bld 104 (*)    AST 14 (*)    All other components within normal limits  CBC - Abnormal; Notable for the following components:   WBC 3.7 (*)    All other components within normal limits  RESP PANEL BY RT-PCR (FLU A&B,  COVID) ARPGX2  LIPASE, BLOOD  URINALYSIS, ROUTINE W REFLEX MICROSCOPIC  PREGNANCY, URINE  GC/CHLAMYDIA PROBE AMP ([place]) NOT AT [facility]    EKG None  Radiology No results found.  Procedures Procedures    Medications Ordered in ED Medications  iohexol (OMNIPAQUE) 300 MG/ML solution 100 mL (80 mLs Intravenous Contrast Given 02/18/22 1410)  ondansetron (ZOFRAN) injection 4 mg (4 mg Intravenous Given 02/18/22 1455)  ibuprofen (ADVIL) tablet 800 mg (800 mg Oral Given 02/18/22 1455)    ED Course/ Medical Decision Making/ A&P                           Medical Decision Making Amount and/or Complexity of Data Reviewed Labs: ordered. Radiology: ordered.  Risk Prescription drug management.   Patient presents to the emergency department with several complaints.  Patient reports that she has been experiencing lower abdominal pain for 2 days.  She has been having some urinary symptoms recently, was treated with an antibiotic.  Differential diagnosis includes cystitis, pyelonephritis, ureterolithiasis, appendicitis, colitis, PID/cervicitis.  Patient was concerned about the possibility of urinary tract infection but urinalysis does not suggest infection.  Pelvic exam was unremarkable.  No cervical motion tenderness, no abnormality.  GC chlamydia pending.  Patient reports that she has had a headache and feels achy all over.  No other specific complaints.  Neurologic exam normal.  No red flags with her headache.  No meningismus or fever.  Patient tested negative for COVID and influenza.  Patient reassured, no further testing necessary.  She appears well, appropriate for discharge.        Final Clinical Impression(s) / ED Diagnoses Final diagnoses:  Lower abdominal pain    Rx / DC Orders ED Discharge Orders     None         Jarion Hawthorne, Gwenyth Allegra, MD 02/23/22 [phone]

## 2022-02-19 LAB — GC/CHLAMYDIA PROBE AMP (~~LOC~~) NOT AT ARMC
Chlamydia: NEGATIVE
Comment: NEGATIVE
Comment: NORMAL
Neisseria Gonorrhea: NEGATIVE

## 2022-03-19 ENCOUNTER — Ambulatory Visit (INDEPENDENT_AMBULATORY_CARE_PROVIDER_SITE_OTHER): Payer: Medicaid Other | Admitting: Medical

## 2022-03-19 ENCOUNTER — Encounter: Payer: Self-pay | Admitting: Medical

## 2022-03-19 ENCOUNTER — Other Ambulatory Visit: Payer: Self-pay

## 2022-03-19 VITALS — BP 108/75 | HR 67 | Wt 158.6 lb

## 2022-03-19 DIAGNOSIS — Z0000 Encounter for general adult medical examination without abnormal findings: Secondary | ICD-10-CM | POA: Diagnosis not present

## 2022-03-19 DIAGNOSIS — Z3042 Encounter for surveillance of injectable contraceptive: Secondary | ICD-10-CM | POA: Diagnosis not present

## 2022-03-19 DIAGNOSIS — Z01419 Encounter for gynecological examination (general) (routine) without abnormal findings: Secondary | ICD-10-CM

## 2022-03-19 DIAGNOSIS — Z1231 Encounter for screening mammogram for malignant neoplasm of breast: Secondary | ICD-10-CM | POA: Diagnosis not present

## 2022-03-19 DIAGNOSIS — Z1330 Encounter for screening examination for mental health and behavioral disorders, unspecified: Secondary | ICD-10-CM | POA: Diagnosis not present

## 2022-03-19 MED ORDER — MEDROXYPROGESTERONE ACETATE 150 MG/ML IM SUSP
150.0000 mg | INTRAMUSCULAR | Status: AC
  Administered 2022-03-19 – 2023-01-29 (×3): 150 mg via INTRAMUSCULAR

## 2022-03-19 NOTE — Progress Notes (Signed)
   History:  [name] is a 41 y.o. [id number] who presents to clinic today for annual exam. She had normal pap smear 12/04/20. She is on depo provera for birth control and is due today. She does not have periods on the Depo. She is due for mammogram. She is sexually active with one female partner. She denies discharge, pain or UTI symptoms today.   The following portions of the patient's history were reviewed and updated as appropriate: allergies, current medications, family history, past medical history, social history, past surgical history and problem list.  Review of Systems:  Review of Systems  Constitutional:  Negative for fever and malaise/fatigue.  Gastrointestinal:  Negative for abdominal pain, constipation, diarrhea, nausea and vomiting.  Genitourinary:  Negative for dysuria, frequency and urgency.       Neg - vaginal bleeding, discharge, pelvic pain      Objective:  Physical Exam BP 108/75   Pulse 67   Wt 158 lb 9.6 oz (71.9 kg)   BMI 27.65 kg/m  Physical Exam Vitals and nursing note reviewed. Exam conducted with a chaperone present.  Constitutional:      General: She is not in acute distress.    Appearance: Normal appearance. She is well-developed and normal weight.  HENT:     Head: Normocephalic and atraumatic.  Neck:     Thyroid: No thyromegaly.  Cardiovascular:     Rate and Rhythm: Normal rate and regular rhythm.     Heart sounds: No murmur heard. Pulmonary:     Effort: Pulmonary effort is normal. No respiratory distress.     Breath sounds: Normal breath sounds. No wheezing.  Abdominal:     General: Abdomen is flat. Bowel sounds are normal. There is no distension.     Palpations: Abdomen is soft. There is no mass.     Tenderness: There is no abdominal tenderness. There is no guarding or rebound.  Musculoskeletal:     Cervical back: Neck supple.  Skin:    General: Skin is warm and dry.     Findings: No erythema.  Neurological:     Mental Status: She is  alert and oriented to person, place, and time.  Psychiatric:        Mood and Affect: Mood normal.      Health Maintenance Due  Topic Date Due   COVID-19 Vaccine (1) Never done   INFLUENZA VACCINE  11/19/2021    Labs, imaging and previous visits in Epic reviewed  Assessment & Plan:  1. Women's annual routine gynecological examination - Next pap smear due 11/2025  2. Surveillance for Depo-Provera contraception - medroxyPROGESTERone (DEPO-PROVERA) injection 150 mg - Return in 12 weeks for next injection  - Discussed starting calcium supplement due to long-term use - Patient may need DEXA scan next year   3. Encounter for screening mammogram for malignant neoplasm of breast - MM Digital Screening; Future  Annual exam in 1 year Depo Provera in 12 weeks  Danielle Rankin 03/19/2022 10:24 AM

## 2022-03-19 NOTE — Patient Instructions (Signed)
500 mg calcium with vitamin D twice a day

## 2022-03-19 NOTE — Progress Notes (Signed)
[name] here for Depo-Provera Injection. Injection administered without complication. Patient will return in 3 months for next injection between 2/14 and 2/28. Next annual visit due November 2024.   Darlyne Russian, RN 03/19/2022  9:26 AM

## 2022-03-21 ENCOUNTER — Ambulatory Visit: Payer: Medicaid Other

## 2022-03-28 ENCOUNTER — Telehealth: Payer: Medicaid Other | Admitting: Family Medicine

## 2022-03-28 DIAGNOSIS — J069 Acute upper respiratory infection, unspecified: Secondary | ICD-10-CM | POA: Diagnosis not present

## 2022-03-28 MED ORDER — FLUTICASONE PROPIONATE 50 MCG/ACT NA SUSP: 2.0000 | Freq: Every day | NASAL | 0 refills | Status: DC

## 2022-03-28 MED ORDER — BENZONATATE 100 MG PO CAPS: 100.0000 mg | ORAL_CAPSULE | Freq: Two times a day (BID) | ORAL | 0 refills | Status: DC | PRN

## 2022-03-28 NOTE — Progress Notes (Signed)

## 2022-04-19 ENCOUNTER — Encounter: Payer: Self-pay | Admitting: Nurse Practitioner

## 2022-04-19 ENCOUNTER — Telehealth: Payer: Medicaid Other

## 2022-05-27 ENCOUNTER — Other Ambulatory Visit: Payer: Self-pay

## 2022-05-27 DIAGNOSIS — B3731 Acute candidiasis of vulva and vagina: Secondary | ICD-10-CM

## 2022-05-27 MED ORDER — FLUCONAZOLE 150 MG PO TABS: 150.0000 mg | ORAL_TABLET | Freq: Once | ORAL | 0 refills | Status: AC

## 2022-06-04 ENCOUNTER — Encounter: Payer: Self-pay | Admitting: Family Medicine

## 2022-06-04 ENCOUNTER — Other Ambulatory Visit: Payer: Self-pay

## 2022-06-04 ENCOUNTER — Ambulatory Visit (INDEPENDENT_AMBULATORY_CARE_PROVIDER_SITE_OTHER): Payer: Medicaid Other

## 2022-06-04 VITALS — BP 127/71 | HR 69 | Ht 63.5 in | Wt 158.4 lb

## 2022-06-04 DIAGNOSIS — Z3042 Encounter for surveillance of injectable contraceptive: Secondary | ICD-10-CM

## 2022-06-04 MED ORDER — MEDROXYPROGESTERONE ACETATE 150 MG/ML IM SUSP
150.0000 mg | Freq: Once | INTRAMUSCULAR | Status: AC
  Administered 2022-06-04: 150 mg via INTRAMUSCULAR

## 2022-06-04 NOTE — Progress Notes (Signed)
[name] here for Depo-Provera Injection. Injection administered without complication. Patient will return in 3 months for next injection between May 2 and Sep 04, 2022. Next annual visit due Nov 2024.   Georgia Lopes, RN 06/04/2022  9:21 AM

## 2022-07-25 ENCOUNTER — Ambulatory Visit: Payer: Medicaid Other

## 2022-08-14 ENCOUNTER — Ambulatory Visit: Payer: Medicaid Other

## 2022-08-21 ENCOUNTER — Ambulatory Visit (INDEPENDENT_AMBULATORY_CARE_PROVIDER_SITE_OTHER): Payer: Medicaid Other

## 2022-08-21 ENCOUNTER — Other Ambulatory Visit: Payer: Self-pay

## 2022-08-21 VITALS — BP 113/66 | HR 64 | Ht 63.5 in | Wt 159.1 lb

## 2022-08-21 DIAGNOSIS — Z3042 Encounter for surveillance of injectable contraceptive: Secondary | ICD-10-CM | POA: Diagnosis not present

## 2022-08-21 MED ORDER — MEDROXYPROGESTERONE ACETATE 150 MG/ML IM SUSY
150.0000 mg | PREFILLED_SYRINGE | Freq: Once | INTRAMUSCULAR | Status: AC
  Administered 2022-08-21: 150 mg via INTRAMUSCULAR

## 2022-08-21 NOTE — Progress Notes (Signed)
Kimberly Macias here for Depo-Provera Injection. Injection administered without complication. Patient will return in 3 months for next injection between July 18 and August 1. Next annual visit due.  Pt advised to schedule with front office upon check out.    Ralene Bathe, RN 08/21/2022  10:18 AM

## 2022-11-06 ENCOUNTER — Ambulatory Visit (INDEPENDENT_AMBULATORY_CARE_PROVIDER_SITE_OTHER): Payer: Medicaid Other | Admitting: *Deleted

## 2022-11-06 VITALS — BP 114/78 | HR 80 | Ht 63.5 in | Wt 157.3 lb

## 2022-11-06 DIAGNOSIS — Z3042 Encounter for surveillance of injectable contraceptive: Secondary | ICD-10-CM

## 2022-11-06 NOTE — Progress Notes (Signed)
Depo Provera 150 mg IM administered as scheduled. Next dose due 10/3-10/17.  Last Pap 11/24/20 - normal. Pt is past due for Annual gyn exam.  Pt reports having nighttime hot flashes, gaining weight and unstable emotions x1.5 yrs.  She desires appt to discuss with provider - will be scheduled.

## 2022-12-09 ENCOUNTER — Ambulatory Visit: Payer: Medicaid Other | Admitting: Obstetrics and Gynecology

## 2023-01-22 ENCOUNTER — Ambulatory Visit: Payer: Medicaid Other

## 2023-01-29 ENCOUNTER — Encounter: Payer: Self-pay | Admitting: Obstetrics and Gynecology

## 2023-01-29 ENCOUNTER — Other Ambulatory Visit: Payer: Self-pay

## 2023-01-29 ENCOUNTER — Ambulatory Visit: Payer: Medicaid Other | Admitting: Obstetrics and Gynecology

## 2023-01-29 VITALS — BP 117/72 | HR 68 | Ht 63.5 in | Wt 158.3 lb

## 2023-01-29 DIAGNOSIS — Z3042 Encounter for surveillance of injectable contraceptive: Secondary | ICD-10-CM

## 2023-01-29 DIAGNOSIS — N951 Menopausal and female climacteric states: Secondary | ICD-10-CM

## 2023-01-29 DIAGNOSIS — Z01419 Encounter for gynecological examination (general) (routine) without abnormal findings: Secondary | ICD-10-CM

## 2023-01-29 NOTE — Patient Instructions (Signed)
Assuming your labs are normal, will start estrogen (hormone replacement) for your hot flashes.

## 2023-01-29 NOTE — Progress Notes (Signed)
Kimberly Macias here for Depo-Provera  Injection.  Injection administered without complication. Patient will return in 3 months 04/16/23 thru 04/30/23 for next injection.  Henrietta Dine, CMA 01/29/2023  10:00 AM

## 2023-01-29 NOTE — Progress Notes (Signed)
ANNUAL EXAM Patient name: Kimberly Macias MRN [id number]  Date of birth: November 13, 1980 Chief Complaint:   Gynecologic Exam  History of Present Illness:   Kimberly Macias is a 42 y.o. [id number] being seen today for a routine annual exam.  Current complaints: annual, depo  Menstrual concerns? No  amenorrhea with depo Breast or nipple changes? No needs to reschedule mammogram  Contraception use? Yes depo Sexually active? Yes no pain with intercourse, female partner  Hookah occasionally/rarely, no regular tobacco use  Having hot flashes - has had this for some time Having difficulty with weight loss Mood concerns/changes recently  May be having vaginal dryness   FH : none of breast, ovarian or colon cancer     No LMP recorded. Patient has had an injection.   The pregnancy intention screening data noted above was reviewed. Potential methods of contraception were discussed. The patient elected to proceed with No data recorded.   Last pap     Component Value Date/Time   DIAGPAP  12/04/2020 1422    - Negative for Intraepithelial Lesions or Malignancy (NILM)   DIAGPAP - Benign reactive/reparative changes 12/04/2020 1422   DIAGPAP  04/22/2017 0000    NEGATIVE FOR INTRAEPITHELIAL LESIONS OR MALIGNANCY.   DIAGPAP  04/22/2017 0000    FUNGAL ORGANISMS PRESENT CONSISTENT WITH CANDIDA SPP.   HPVHIGH Negative 12/04/2020 1422   ADEQPAP  12/04/2020 1422    Satisfactory for evaluation; transformation zone component PRESENT.   ADEQPAP  04/22/2017 0000    Satisfactory for evaluation  endocervical/transformation zone component ABSENT.     H/O abnormal pap: no Last mammogram: not completed yet.  Last colonoscopy: n/a.      03/19/2022    9:28 AM 09/27/2021   10:25 AM 05/17/2021    8:54 AM 12/04/2020    2:19 PM 03/27/2020    9:37 AM  Depression screen PHQ 2/9  Decreased Interest 0 -- 0 1 0  Down, Depressed, Hopeless 0 1 0 2 0  PHQ - 2 Score 0 1 0 3 0  Altered sleeping 1 1 1 2 1   Tired,  decreased energy 1 1 1 1 1   Change in appetite 0 2 0 0 0  Feeling bad or failure about yourself  0 0 0 1 0  Trouble concentrating 0 0 0 1 0  Moving slowly or fidgety/restless 0 0 0 0 0  Suicidal thoughts 0 0 0 0 0  PHQ-9 Score 2 5 2 8 2         03/19/2022    9:28 AM 09/27/2021   10:26 AM 05/17/2021    8:54 AM 12/04/2020    2:20 PM  GAD 7 : Generalized Anxiety Score  Nervous, Anxious, on Edge 0 1 0 1  Control/stop worrying 0 0 1 2  Worry too much - different things  0 1 2  Trouble relaxing  1 1 2   Restless  1 0 0  Easily annoyed or irritable  1 1 0  Afraid - awful might happen  0 0 2  Total GAD 7 Score  4 4 9      Review of Systems:   Pertinent items are noted in HPI Denies any headaches, blurred vision, fatigue, shortness of breath, chest pain, abdominal pain, abnormal vaginal discharge/itching/odor/irritation, problems with periods, bowel movements, urination, or intercourse unless otherwise stated above. Pertinent History Reviewed:  Reviewed past medical,surgical, social and family history.  Reviewed problem list, medications and allergies. Physical Assessment:   Vitals:   01/29/23 1010  BP:  117/72  Pulse: 68  Weight: 158 lb 4.8 oz (71.8 kg)  Height: 5' 3.5" (1.613 m)  Body mass index is 27.6 kg/m.        Physical Examination:   General appearance - well appearing, and in no distress  Mental status - alert, oriented to person, place, and time  Psych:  She has a normal mood and affect  Skin - warm and dry, normal color, no suspicious lesions noted  Chest - effort normal, all lung fields clear to auscultation bilaterally  Heart - normal rate and regular rhythm  Abdomen - soft, nontender, nondistended, no masses or organomegaly  Pelvic - deferred  Extremities:  No swelling or varicosities noted  Chaperone present for exam  No results found for this or any previous visit (from the past 24 hour(s)).    Assessment & Plan:   1. Well woman exam with routine  gynecological exam - Cervical cancer screening: Discussed guidelines. Pap with HPV due 2025-2027 - Birth Control: Discussed options and their risks, benefits and common side effects; discussed VTE with estrogen containing options. Desires: Depo - Breast Health: Encouraged self breast awareness/SBE. Teaching provided. Discussed limits of clinical breast exam for detecting breast cancer.  scheduled - F/U 12 months and prn   2. Menopausal symptoms Menopausal symptoms: hot flashes, moodiness, vaginal dryness Considering HRT  Calculated ASCVD risk: TBD, will obtain lipid panel today   FH of breast cancer? No Breast cancer risk: no elevated risk Contraceptive hx: depo for several years She is most bothered by the hot flashes - Lipid panel - HgB A1c - FSH  3. Encounter for management and injection of depo-Provera Continue depo      No orders of the defined types were placed in this encounter.   Meds: No orders of the defined types were placed in this encounter.   Follow-up: No follow-ups on file.  Lorriane Shire, MD 01/29/2023 10:16 AM

## 2023-01-30 ENCOUNTER — Other Ambulatory Visit: Payer: Self-pay | Admitting: Obstetrics and Gynecology

## 2023-01-30 DIAGNOSIS — N951 Menopausal and female climacteric states: Secondary | ICD-10-CM

## 2023-01-30 LAB — FOLLICLE STIMULATING HORMONE: FSH: 9.2 m[IU]/mL

## 2023-01-30 LAB — LIPID PANEL
Chol/HDL Ratio: 4 {ratio} (ref 0.0–4.4)
Cholesterol, Total: 157 mg/dL (ref 100–199)
HDL: 39 mg/dL — ABNORMAL LOW (ref >39)
LDL Chol Calc (NIH): 109 mg/dL — ABNORMAL HIGH (ref 0–99)
Triglycerides: 40 mg/dL (ref 0–149)
VLDL Cholesterol Cal: 9 mg/dL (ref 5–40)

## 2023-01-30 LAB — HEMOGLOBIN A1C
Est. average glucose Bld gHb Est-mCnc: 111 mg/dL
Hgb A1c MFr Bld: 5.5 % (ref 4.8–5.6)

## 2023-01-30 MED ORDER — ESTRADIOL 0.025 MG/24HR TD PTWK: 0.0250 mg | MEDICATED_PATCH | TRANSDERMAL | 12 refills | Status: DC

## 2023-02-03 ENCOUNTER — Ambulatory Visit: Payer: Medicaid Other

## 2023-02-04 ENCOUNTER — Ambulatory Visit
Admission: RE | Admit: 2023-02-04 | Discharge: 2023-02-04 | Disposition: A | Discharge status: Home / Self Care | Payer: Medicaid Other | Source: Ambulatory Visit | Attending: Medical | Admitting: Medical

## 2023-02-04 DIAGNOSIS — Z1231 Encounter for screening mammogram for malignant neoplasm of breast: Secondary | ICD-10-CM

## 2023-02-06 ENCOUNTER — Other Ambulatory Visit: Payer: Self-pay | Admitting: Medical

## 2023-02-06 ENCOUNTER — Other Ambulatory Visit: Payer: Self-pay | Admitting: Primary Care

## 2023-02-06 DIAGNOSIS — R928 Other abnormal and inconclusive findings on diagnostic imaging of breast: Secondary | ICD-10-CM

## 2023-02-12 ENCOUNTER — Ambulatory Visit: Payer: Medicaid Other

## 2023-02-12 ENCOUNTER — Ambulatory Visit
Admission: RE | Admit: 2023-02-12 | Discharge: 2023-02-12 | Disposition: A | Discharge status: Home / Self Care | Payer: Medicaid Other | Source: Ambulatory Visit | Attending: Medical | Admitting: Medical

## 2023-02-12 DIAGNOSIS — R928 Other abnormal and inconclusive findings on diagnostic imaging of breast: Secondary | ICD-10-CM

## 2023-02-16 ENCOUNTER — Other Ambulatory Visit: Payer: Medicaid Other

## 2023-03-05 ENCOUNTER — Ambulatory Visit: Payer: Medicaid Other | Admitting: Obstetrics and Gynecology

## 2023-03-26 ENCOUNTER — Encounter (HOSPITAL_COMMUNITY): Payer: Self-pay

## 2023-03-26 ENCOUNTER — Emergency Department (HOSPITAL_COMMUNITY)
Admission: EM | Admit: 2023-03-26 | Discharge: 2023-03-26 | Disposition: A | Discharge status: Home / Self Care | Payer: No Typology Code available for payment source | Attending: Emergency Medicine | Admitting: Emergency Medicine

## 2023-03-26 ENCOUNTER — Emergency Department (HOSPITAL_COMMUNITY): Payer: No Typology Code available for payment source

## 2023-03-26 ENCOUNTER — Other Ambulatory Visit: Payer: Self-pay

## 2023-03-26 DIAGNOSIS — R0789 Other chest pain: Secondary | ICD-10-CM | POA: Insufficient documentation

## 2023-03-26 DIAGNOSIS — S60221A Contusion of right hand, initial encounter: Secondary | ICD-10-CM | POA: Insufficient documentation

## 2023-03-26 DIAGNOSIS — S161XXA Strain of muscle, fascia and tendon at neck level, initial encounter: Secondary | ICD-10-CM | POA: Insufficient documentation

## 2023-03-26 DIAGNOSIS — Y92410 Unspecified street and highway as the place of occurrence of the external cause: Secondary | ICD-10-CM | POA: Insufficient documentation

## 2023-03-26 DIAGNOSIS — S8392XA Sprain of unspecified site of left knee, initial encounter: Secondary | ICD-10-CM | POA: Insufficient documentation

## 2023-03-26 DIAGNOSIS — S0990XA Unspecified injury of head, initial encounter: Secondary | ICD-10-CM | POA: Diagnosis present

## 2023-03-26 DIAGNOSIS — S3991XA Unspecified injury of abdomen, initial encounter: Secondary | ICD-10-CM | POA: Insufficient documentation

## 2023-03-26 DIAGNOSIS — S060X0A Concussion without loss of consciousness, initial encounter: Secondary | ICD-10-CM | POA: Insufficient documentation

## 2023-03-26 LAB — CBC
HCT: 43 % (ref 36.0–46.0)
Hemoglobin: 14.2 g/dL (ref 12.0–15.0)
MCH: 31.5 pg (ref 26.0–34.0)
MCHC: 33 g/dL (ref 30.0–36.0)
MCV: 95.3 fL (ref 80.0–100.0)
Platelets: 283 10*3/uL (ref 150–400)
RBC: 4.51 MIL/uL (ref 3.87–5.11)
RDW: 13 % (ref 11.5–15.5)
WBC: 4.6 10*3/uL (ref 4.0–10.5)
nRBC: 0 % (ref 0.0–0.2)

## 2023-03-26 LAB — I-STAT CHEM 8, ED
BUN: 8 mg/dL (ref 6–20)
Calcium, Ion: 1.12 mmol/L — ABNORMAL LOW (ref 1.15–1.40)
Chloride: 107 mmol/L (ref 98–111)
Creatinine, Ser: 1.2 mg/dL — ABNORMAL HIGH (ref 0.44–1.00)
Glucose, Bld: 112 mg/dL — ABNORMAL HIGH (ref 70–99)
HCT: 44 % (ref 36.0–46.0)
Hemoglobin: 15 g/dL (ref 12.0–15.0)
Potassium: 3.5 mmol/L (ref 3.5–5.1)
Sodium: 144 mmol/L (ref 135–145)
TCO2: 24 mmol/L (ref 22–32)

## 2023-03-26 LAB — COMPREHENSIVE METABOLIC PANEL
ALT: 22 U/L (ref 0–44)
AST: 23 U/L (ref 15–41)
Albumin: 3.6 g/dL (ref 3.5–5.0)
Alkaline Phosphatase: 67 U/L (ref 38–126)
Anion gap: 12 (ref 5–15)
BUN: 6 mg/dL (ref 6–20)
CO2: 18 mmol/L — ABNORMAL LOW (ref 22–32)
Calcium: 8.4 mg/dL — ABNORMAL LOW (ref 8.9–10.3)
Chloride: 107 mmol/L (ref 98–111)
Creatinine, Ser: 0.86 mg/dL (ref 0.44–1.00)
GFR, Estimated: >60 mL/min (ref >60)
Glucose, Bld: 119 mg/dL — ABNORMAL HIGH (ref 70–99)
Potassium: 3.2 mmol/L — ABNORMAL LOW (ref 3.5–5.1)
Sodium: 137 mmol/L (ref 135–145)
Total Bilirubin: 0.5 mg/dL (ref <1.2)
Total Protein: 6.9 g/dL (ref 6.5–8.1)

## 2023-03-26 LAB — HCG, SERUM, QUALITATIVE: Preg, Serum: NEGATIVE

## 2023-03-26 LAB — ETHANOL: Alcohol, Ethyl (B): 224 mg/dL — ABNORMAL HIGH (ref <10)

## 2023-03-26 MED ORDER — HYDROCODONE-ACETAMINOPHEN 5-325 MG PO TABS: 1.0000 | ORAL_TABLET | Freq: Four times a day (QID) | ORAL | 0 refills | Status: DC | PRN

## 2023-03-26 MED ORDER — FENTANYL CITRATE PF 50 MCG/ML IJ SOSY
50.0000 ug | PREFILLED_SYRINGE | Freq: Once | INTRAMUSCULAR | Status: AC
  Administered 2023-03-26: 50 ug via INTRAVENOUS
  Filled 2023-03-26: qty 1

## 2023-03-26 MED ORDER — IOHEXOL 350 MG/ML SOLN
75.0000 mL | Freq: Once | INTRAVENOUS | Status: AC | PRN
  Administered 2023-03-26: 75 mL via INTRAVENOUS
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
## 2023-03-26 NOTE — ED Notes (Addendum)
C-Collar removed 
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
## 2023-03-26 NOTE — ED Notes (Signed)
Cmp resent at this time

## 2023-03-26 NOTE — ED Notes (Signed)
Patient transported to CT 
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
## 2024-01-18 ENCOUNTER — Ambulatory Visit: Admitting: Obstetrics and Gynecology

## 2024-03-11 ENCOUNTER — Ambulatory Visit: Admitting: Obstetrics and Gynecology

## 2024-03-17 ENCOUNTER — Telehealth: Admitting: Family Medicine

## 2024-03-17 DIAGNOSIS — L309 Dermatitis, unspecified: Secondary | ICD-10-CM

## 2024-03-17 MED ORDER — PREDNISONE 10 MG (21) PO TBPK: ORAL_TABLET | ORAL | 0 refills | Status: DC

## 2024-03-17 NOTE — Patient Instructions (Signed)
 Cornelius Bellman, thank you for joining Roosvelt Mater, PA-C for today's virtual visit.  While this provider is not your primary care provider (PCP), if your PCP is located in our provider database this encounter information will be shared with them immediately following your visit.   A [facility] MyChart account gives you access to today's visit and all your visits, tests, and labs performed at [facility]  click here if you don't have a [facility] MyChart account or go to [email or web address]  Consent: (Patient) Kimberly Macias provided verbal consent for this virtual visit at the beginning of the encounter.  Current Medications:  Current Outpatient Medications:    predniSONE  (STERAPRED UNI-PAK 21 TAB) 10 MG (21) TBPK tablet, Take following package directions., Disp: 21 tablet, Rfl: 0   estradiol  (CLIMARA ) 0.025 mg/24hr patch, Place 1 patch (0.025 mg total) onto the skin once a week., Disp: 4 patch, Rfl: 12   HYDROcodone -acetaminophen  (NORCO/VICODIN) 5-325 MG tablet, Take 1 tablet by mouth every 6 (six) hours as needed., Disp: 5 tablet, Rfl: 0   Medications ordered in this encounter:  Meds ordered this encounter  Medications   predniSONE  (STERAPRED UNI-PAK 21 TAB) 10 MG (21) TBPK tablet    Sig: Take following package directions.    Dispense:  21 tablet    Refill:  0    Please dispense one standard blister pack taper.     *If you need refills on other medications prior to your next appointment, please contact your pharmacy*  Follow-Up: Call back or seek an in-person evaluation if the symptoms worsen or if the condition fails to improve as anticipated.  [facility] [phone]  Other Instructions Rash, Adult A rash is a breakout of spots or blotches on the skin. It can affect the way the skin looks and feels. Many things can cause a rash. Common causes include: Viral infections. These include colds, measles, and hand, foot, and mouth  disease. Bacterial infections. These include scarlet fever and impetigo. Fungal infections. These include athlete's foot, ringworm, and yeast rashes. Skin irritation. This may be from heat rash, exposure to moisture or friction for a long time (intertrigo), or exposure to soap or skin care products (eczema). Allergic reactions. These may be caused by foods, medicines, or things like poison ivy. Some rashes may go away after a few days. Others may last for a few weeks. The goal of treatment is to stop the itching and keep the rash from spreading. Follow these instructions at home: Medicine Take or apply over-the-counter and prescription medicines only as told by your health care provider. These may include: Corticosteroids. These can help treat red or swollen skin. They may be given as creams or as medicines to take by mouth (orally). Anti-itch lotions. Allergy medicines. Pain medicine. Antifungal medicine if the rash is from a fungal infection. Antibiotics if you have an infection.  Skin care Apply cool, wet cloths (compresses) to the affected areas. Do not scratch or rub your skin. Avoid covering the rash. Keep it exposed to air as often as you can. Managing itching and discomfort Avoid hot showers and baths. These can make itching worse. A cold shower may help. Try taking a bath with: Epsom salts. You can get these at your local pharmacy or grocery store. Follow the instructions on the package. Baking soda. Pour a small amount into the bath as told by your provider. Colloidal oatmeal. You can get this at your local pharmacy or grocery store. Follow the instructions on  the package. Try putting baking soda paste on your skin. Stir water into baking soda until it becomes like a paste. Try using calamine lotion or cortisone cream to help with itchiness. Keep cool. Stay out of the sun. Sweating and being hot can make itching worse. General instructions  Rest as needed. Drink enough fluid  to keep your pee (urine) pale yellow. Wear loose-fitting clothes. Avoid scented soaps, detergents, and perfumes. Use gentle soaps, detergents, perfumes, and cosmetics. Avoid the things that cause your rash (triggers). Keep a journal to help keep track of your triggers. Write down: What you eat. What cosmetics you use. What you drink. What you wear. This includes jewelry. Contact a health care provider if: You sweat at night more than normal. You pee (urinate) more or less than normal, or your pee is a darker color than normal. Your eyes become sensitive to light. Your skin or the white parts of your eyes turn yellow (jaundice). Your skin tingles or is numb. You get painful blisters in your nose or mouth. Your rash does not go away after a few days, or it gets worse. You are more tired or thirsty than normal. You have new or worse symptoms. These may include: Pain in your abdomen. Fever. Diarrhea or vomiting. Weakness or weight loss. Get help right away if: You get confused. You have a severe headache, a stiff neck, or severe joint pain or stiffness. You become very sleepy or not responsive. You have a seizure. This information is not intended to replace advice given to you by your health care provider. Make sure you discuss any questions you have with your health care provider. Document Revised: 01/24/2022 Document Reviewed: 01/24/2022 Elsevier Patient Education  2024 Elsevier Inc.   If you have been instructed to have an in-person evaluation today at a local Urgent Care facility, please use the link below. It will take you to a list of all of our available [facility], including address, phone number and hours of operation. Please do not delay care.  [facility]  If you or a family member do not have a primary care provider, use the link below to schedule a visit and establish care. When you choose a [facility] primary care physician or advanced  practice provider, you gain a long-term partner in health. Find a Primary Care Provider  Learn more about [facility] in-office and virtual care options:  - Get Care Now

## 2024-03-17 NOTE — Progress Notes (Signed)
 Virtual Visit Consent   Kimberly Macias, you are scheduled for a virtual visit with a [facility] provider today. Just as with appointments in the office, your consent must be obtained to participate. Your consent will be active for this visit and any virtual visit you may have with one of our providers in the next 365 days. If you have a MyChart account, a copy of this consent can be sent to you electronically.  As this is a virtual visit, video technology does not allow for your provider to perform a traditional examination. This may limit your provider's ability to fully assess your condition. If your provider identifies any concerns that need to be evaluated in person or the need to arrange testing (such as labs, EKG, etc.), we will make arrangements to do so. Although advances in technology are sophisticated, we cannot ensure that it will always work on either your end or our end. If the connection with a video visit is poor, the visit may have to be switched to a telephone visit. With either a video or telephone visit, we are not always able to ensure that we have a secure connection.  By engaging in this virtual visit, you consent to the provision of healthcare and authorize for your insurance to be billed (if applicable) for the services provided during this visit. Depending on your insurance coverage, you may receive a charge related to this service.  I need to obtain your verbal consent now. Are you willing to proceed with your visit today? [name] has provided verbal consent on 03/17/2024 for a virtual visit (video or telephone). Kimberly Macias, [place]  Date: 03/17/2024 11:32 AM   Virtual Visit via Video Note   I, Kimberly Macias, connected with  Malaina Mortellaro  ([id number], 1980-09-14) on 03/17/24 at 11:30 AM EST by a video-enabled telemedicine application and verified that I am speaking with the correct person using two identifiers.  Location: Patient: Virtual Visit Location Patient:  Home Provider: Virtual Visit Location Provider: Home Office   I discussed the limitations of evaluation and management by telemedicine and the availability of in person appointments. The patient expressed understanding and agreed to proceed.    History of Present Illness: Kimberly Macias is a 43 y.o. who identifies as a female who was assigned female at birth, and is being seen today for c/o having skin problems recently.  Pt states she has redness, itchiness on her face.  Pt states face is itchy and dry.  Pt states symptoms on going for a week.  Pt states using daughters eczema medication and using Vaseline. Pt states she used the cream and it helped the itchiness.    HPI: HPI  Problems:  Patient Active Problem List   Diagnosis Date Noted   Encounter for annual routine gynecological examination 09/29/2019   Abnormal urine odor 09/29/2019   Change in skin mole 09/29/2019   Syncope 07/07/2017    Allergies: No Known Allergies Medications:  Current Outpatient Medications:    predniSONE  (STERAPRED UNI-PAK 21 TAB) 10 MG (21) TBPK tablet, Take following package directions., Disp: 21 tablet, Rfl: 0   estradiol  (CLIMARA ) 0.025 mg/24hr patch, Place 1 patch (0.025 mg total) onto the skin once a week., Disp: 4 patch, Rfl: 12   HYDROcodone -acetaminophen  (NORCO/VICODIN) 5-325 MG tablet, Take 1 tablet by mouth every 6 (six) hours as needed., Disp: 5 tablet, Rfl: 0  Observations/Objective: Patient is well-developed, well-nourished in no acute distress.  Resting comfortably at home.  Head is normocephalic, atraumatic.  No labored breathing.  Speech is clear and coherent with logical content.  Patient is alert and oriented at baseline.    Assessment and Plan: 1. Dermatitis (Primary) - predniSONE  (STERAPRED UNI-PAK 21 TAB) 10 MG (21) TBPK tablet; Take following package directions.  Dispense: 21 tablet; Refill: 0  -Advised Pt to increase militarization to facial area -Pt advised if symptoms persist  to follow up in person with PCP or urgent care for further evaluation.   Follow Up Instructions: I discussed the assessment and treatment plan with the patient. The patient was provided an opportunity to ask questions and all were answered. The patient agreed with the plan and demonstrated an understanding of the instructions.  A copy of instructions were sent to the patient via MyChart unless otherwise noted below.    The patient was advised to call back or seek an in-person evaluation if the symptoms worsen or if the condition fails to improve as anticipated.    Kimberly Mater, PA-C

## 2024-04-11 ENCOUNTER — Telehealth: Admitting: Family Medicine

## 2024-04-11 DIAGNOSIS — J111 Influenza due to unidentified influenza virus with other respiratory manifestations: Secondary | ICD-10-CM | POA: Diagnosis not present

## 2024-04-11 MED ORDER — OSELTAMIVIR PHOSPHATE 75 MG PO CAPS: 75.0000 mg | ORAL_CAPSULE | Freq: Two times a day (BID) | ORAL | 0 refills | Status: AC

## 2024-04-11 MED ORDER — NAPROXEN 500 MG PO TABS: 500.0000 mg | ORAL_TABLET | Freq: Two times a day (BID) | ORAL | 0 refills | Status: AC

## 2024-04-11 MED ORDER — PROMETHAZINE-DM 6.25-15 MG/5ML PO SYRP: 5.0000 mL | ORAL_SOLUTION | Freq: Four times a day (QID) | ORAL | 0 refills | Status: AC | PRN

## 2024-04-11 NOTE — Progress Notes (Signed)
 E visit for Flu like symptoms   We are sorry that you are not feeling well.  Here is how we plan to help! Based on what you have shared with me it looks like you may have a respiratory virus that may be influenza.  Influenza or the flu is  an infection caused by a respiratory virus. The flu virus is highly contagious and persons who did not receive their yearly flu vaccination may catch the flu from close contact.  We have anti-viral medications to treat the viruses that cause this infection. They are not a cure and only shorten the course of the infection. These prescriptions are most effective when they are given within the first 2 days of flu symptoms. Antiviral medications are indicated if you have a high risk of complications from the flu. You should  also consider an antiviral medication if you are in close contact with someone who is at risk. These medications can help patients avoid complications from the flu but have side effects that you should know.   Possible side effects from Tamiflu  or oseltamivir  include nausea, vomiting, diarrhea, dizziness, headaches, eye redness, sleep problems or other respiratory symptoms. You should not take Tamiflu  if you have an allergy to oseltamivir  or any to the ingredients in Tamiflu .  Based upon your symptoms and potential risk factors I have prescribed Oseltamivir  (Tamiflu ).  It has been sent to your designated pharmacy.  You will take one 75 mg capsule orally twice a day for the next 5 days.   For nasal congestion, you may use an oral decongestant such as Mucinex D or if you have glaucoma or high blood pressure use plain Mucinex.  Saline nasal spray or nasal drops can help and can safely be used as often as needed for congestion.  If you have a sore or scratchy throat, use a saltwater gargle-  to  teaspoon of salt dissolved in a 4-ounce to 8-ounce glass of warm water.  Gargle the solution for approximately 15-30 seconds and then spit.  It is  important not to swallow the solution.  You can also use throat lozenges/cough drops and Chloraseptic spray to help with throat pain or discomfort.  Warm or cold liquids can also be helpful in relieving throat pain.  For headache, pain or general discomfort, you can use Ibuprofen  or Tylenol  as directed.   Some authorities believe that zinc sprays or the use of Echinacea may shorten the course of your symptoms.  I have prescribed the following medications to help lessen symptoms: I have prescribed Phenergan  DM 6.25 mg/15 mg. You make take one teaspoon / 5 ml every 4-6 hours as needed for cough and I have prescribed an anti-inflammatory - Naprosyn  500 mg. Take twice daily as needed for fever or body aches for 2 weeks  You are to isolate at home until you have been fever-free for at least 24 hours without a fever-reducing medication, and symptoms have been steadily improving for 24 hours.  If you must be around other household members who do not have symptoms, you need to make sure that both you and the family members are masking consistently with a high-quality mask.  If you note any worsening of symptoms despite treatment, please seek an in-person evaluation ASAP. If you note any significant shortness of breath or any chest pain, please seek ED evaluation. Please do not delay care!  ANYONE WHO HAS FLU SYMPTOMS SHOULD: Stay home. The flu is highly contagious and going out or to  work exposes others! Be sure to drink plenty of fluids. Water is fine as well as fruit juices, sodas and electrolyte beverages. You may want to stay away from caffeine or alcohol. If you are nauseated, try taking small sips of liquids. How do you know if you are getting enough fluid? Your urine should be a pale yellow or almost colorless. Get rest. Taking a steamy shower or using a humidifier may help nasal congestion and ease sore throat pain. Using a saline nasal spray works much the same way. Cough drops, hard candies and  sore throat lozenges may ease your cough. Line up a caregiver. Have someone check on you regularly.  GET HELP RIGHT AWAY IF: You cannot keep down liquids or your medications. You become short of breath Your fell like you are going to pass out or loose consciousness. Your symptoms persist after you have completed your treatment plan  MAKE SURE YOU  Understand these instructions. Will watch your condition. Will get help right away if you are not doing well or get worse.  Your e-visit answers were reviewed by a board certified advanced clinical practitioner to complete your personal care plan.  Depending on the condition, your plan could have included both over the counter or prescription medications.  If there is a problem please reply  once you have received a response from your provider.  Your safety is important to us .  If you have drug allergies check your prescription carefully.    You can use MyChart to ask questions about todays visit, request a non-urgent call back, or ask for a work or school excuse for 24 hours related to this e-Visit. If it has been greater than 24 hours you will need to follow up with your provider, or enter a new e-Visit to address those concerns.  You will get an e-mail in the next two days asking about your experience.  I hope that your e-visit has been valuable and will speed your recovery. Thank you for using e-visits.   I have spent 5 minutes in review of e-visit questionnaire, review and updating patient chart, medical decision making and response to patient.   Delon CHRISTELLA Dickinson, PA-C

## 2024-05-17 ENCOUNTER — Ambulatory Visit: Admitting: Certified Nurse Midwife

## 2024-05-17 NOTE — Progress Notes (Signed)
 Patient rescheduled appointment.   Camie Rote, MSN, CNM, RNC-OB Certified Nurse Midwife, [facility] 05/17/2024 12:10 PM

## 2024-05-26 ENCOUNTER — Ambulatory Visit: Admitting: Obstetrics & Gynecology

## 2024-06-16 ENCOUNTER — Ambulatory Visit: Admitting: Obstetrics and Gynecology
